# Patient Record
Sex: Female | Born: 1985 | Race: White | Hispanic: No | Marital: Married | Smoking: Never smoker
Health system: Southern US, Community
[De-identification: ages and names within clinical notes are randomized; demographics above are authoritative.]

## PROBLEM LIST (undated history)

## (undated) DIAGNOSIS — F32A Depression, unspecified: Secondary | ICD-10-CM

## (undated) DIAGNOSIS — N189 Chronic kidney disease, unspecified: Secondary | ICD-10-CM

## (undated) DIAGNOSIS — F329 Major depressive disorder, single episode, unspecified: Secondary | ICD-10-CM

## (undated) DIAGNOSIS — R55 Syncope and collapse: Secondary | ICD-10-CM

## (undated) DIAGNOSIS — Z87442 Personal history of urinary calculi: Secondary | ICD-10-CM

## (undated) DIAGNOSIS — F319 Bipolar disorder, unspecified: Secondary | ICD-10-CM

## (undated) DIAGNOSIS — F419 Anxiety disorder, unspecified: Secondary | ICD-10-CM

## (undated) HISTORY — PX: ADENOIDECTOMY: SUR15

## (undated) HISTORY — PX: ABDOMINAL HYSTERECTOMY: SHX81

## (undated) HISTORY — PX: TONSILLECTOMY: SUR1361

---
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
## 2007-08-11 ENCOUNTER — Ambulatory Visit: Payer: Self-pay | Admitting: Otolaryngology

## 2007-08-12 ENCOUNTER — Ambulatory Visit: Payer: Self-pay | Admitting: Otolaryngology

## 2008-09-04 ENCOUNTER — Emergency Department: Payer: Self-pay | Admitting: Emergency Medicine

## 2009-07-30 ENCOUNTER — Ambulatory Visit: Payer: Self-pay

## 2009-11-30 ENCOUNTER — Ambulatory Visit: Payer: Self-pay

## 2012-09-09 ENCOUNTER — Emergency Department: Payer: Self-pay | Admitting: Emergency Medicine

## 2013-11-06 LAB — OB RESULTS CONSOLE RUBELLA ANTIBODY, IGM: Rubella: IMMUNE

## 2013-11-06 LAB — OB RESULTS CONSOLE RPR: RPR: NONREACTIVE

## 2013-11-06 LAB — OB RESULTS CONSOLE GC/CHLAMYDIA
Chlamydia: NEGATIVE
GC PROBE AMP, GENITAL: NEGATIVE

## 2013-11-06 LAB — OB RESULTS CONSOLE ABO/RH: RH TYPE: POSITIVE

## 2013-11-06 LAB — OB RESULTS CONSOLE HEPATITIS B SURFACE ANTIGEN: Hepatitis B Surface Ag: NEGATIVE

## 2013-11-06 LAB — OB RESULTS CONSOLE GBS: GBS: NEGATIVE

## 2014-02-03 ENCOUNTER — Emergency Department: Payer: Self-pay | Admitting: Emergency Medicine

## 2014-02-03 LAB — URINALYSIS, COMPLETE
Glucose,UR: NEGATIVE mg/dL (ref 0–75)
KETONE: NEGATIVE
Leukocyte Esterase: NEGATIVE
Nitrite: NEGATIVE
Ph: 6 (ref 4.5–8.0)
Protein: 30
RBC,UR: 7078 /HPF (ref 0–5)
SPECIFIC GRAVITY: 1.02 (ref 1.003–1.030)
WBC UR: 35 /HPF (ref 0–5)

## 2014-03-08 ENCOUNTER — Inpatient Hospital Stay (HOSPITAL_COMMUNITY): Admission: AD | Admit: 2014-03-08 | Payer: Self-pay | Source: Ambulatory Visit | Admitting: Obstetrics & Gynecology

## 2014-06-05 ENCOUNTER — Encounter (HOSPITAL_COMMUNITY)
Admission: AD | Disposition: A | Discharge status: Home / Self Care | Payer: Self-pay | Source: Ambulatory Visit | Attending: Obstetrics and Gynecology

## 2014-06-05 ENCOUNTER — Encounter (HOSPITAL_COMMUNITY): Payer: Self-pay | Admitting: Medical

## 2014-06-05 ENCOUNTER — Inpatient Hospital Stay (HOSPITAL_COMMUNITY)
Admission: AD | Admit: 2014-06-05 | Discharge: 2014-06-07 | DRG: 765 | Disposition: A | Discharge status: Home / Self Care | Payer: 59 | Source: Ambulatory Visit | Attending: Obstetrics and Gynecology | Admitting: Obstetrics and Gynecology

## 2014-06-05 ENCOUNTER — Inpatient Hospital Stay (HOSPITAL_COMMUNITY): Payer: 59 | Admitting: Anesthesiology

## 2014-06-05 ENCOUNTER — Encounter (HOSPITAL_COMMUNITY): Payer: 59 | Admitting: Anesthesiology

## 2014-06-05 DIAGNOSIS — Z3403 Encounter for supervision of normal first pregnancy, third trimester: Secondary | ICD-10-CM | POA: Diagnosis present

## 2014-06-05 DIAGNOSIS — O26833 Pregnancy related renal disease, third trimester: Secondary | ICD-10-CM | POA: Diagnosis present

## 2014-06-05 DIAGNOSIS — N189 Chronic kidney disease, unspecified: Secondary | ICD-10-CM | POA: Diagnosis present

## 2014-06-05 DIAGNOSIS — Z3A36 36 weeks gestation of pregnancy: Secondary | ICD-10-CM | POA: Diagnosis present

## 2014-06-05 DIAGNOSIS — O42913 Preterm premature rupture of membranes, unspecified as to length of time between rupture and onset of labor, third trimester: Principal | ICD-10-CM | POA: Diagnosis present

## 2014-06-05 DIAGNOSIS — O4290 Premature rupture of membranes, unspecified as to length of time between rupture and onset of labor, unspecified weeks of gestation: Secondary | ICD-10-CM | POA: Diagnosis present

## 2014-06-05 HISTORY — DX: Chronic kidney disease, unspecified: N18.9

## 2014-06-05 LAB — CBC
HCT: 36.5 % (ref 36.0–46.0)
Hemoglobin: 12.1 g/dL (ref 12.0–15.0)
MCH: 30.3 pg (ref 26.0–34.0)
MCHC: 33.2 g/dL (ref 30.0–36.0)
MCV: 91.5 fL (ref 78.0–100.0)
Platelets: 142 10*3/uL — ABNORMAL LOW (ref 150–400)
RBC: 3.99 MIL/uL (ref 3.87–5.11)
RDW: 12.9 % (ref 11.5–15.5)
WBC: 10.1 10*3/uL (ref 4.0–10.5)

## 2014-06-05 LAB — TYPE AND SCREEN
ABO/RH(D): A POS
ANTIBODY SCREEN: NEGATIVE

## 2014-06-05 LAB — AMNISURE RUPTURE OF MEMBRANE (ROM) NOT AT ARMC: AMNISURE: POSITIVE

## 2014-06-05 LAB — RPR

## 2014-06-05 LAB — ABO/RH: ABO/RH(D): A POS

## 2014-06-05 LAB — HIV ANTIBODY (ROUTINE TESTING W REFLEX): HIV: NONREACTIVE

## 2014-06-05 SURGERY — Surgical Case
Anesthesia: Epidural | Site: Abdomen

## 2014-06-05 MED ORDER — SODIUM CHLORIDE 0.9 % IJ SOLN
INTRAMUSCULAR | Status: AC
  Filled 2014-06-05: qty 20

## 2014-06-05 MED ORDER — MORPHINE SULFATE (PF) 0.5 MG/ML IJ SOLN
INTRAMUSCULAR | Status: DC | PRN
  Administered 2014-06-05: 1 mg via INTRAVENOUS

## 2014-06-05 MED ORDER — SCOPOLAMINE 1 MG/3DAYS TD PT72
1.0000 | MEDICATED_PATCH | Freq: Once | TRANSDERMAL | Status: DC
  Administered 2014-06-05: 1.5 mg via TRANSDERMAL

## 2014-06-05 MED ORDER — EPHEDRINE 5 MG/ML INJ: 10.0000 mg | INTRAVENOUS | Status: DC | PRN

## 2014-06-05 MED ORDER — LACTATED RINGERS IV SOLN
INTRAVENOUS | Status: DC | PRN
  Administered 2014-06-05: 21:00:00 via INTRAVENOUS

## 2014-06-05 MED ORDER — KETOROLAC TROMETHAMINE 30 MG/ML IJ SOLN
INTRAMUSCULAR | Status: AC
  Filled 2014-06-05: qty 1

## 2014-06-05 MED ORDER — OXYTOCIN 10 UNIT/ML IJ SOLN
INTRAMUSCULAR | Status: AC
  Filled 2014-06-05: qty 4

## 2014-06-05 MED ORDER — OXYTOCIN 40 UNITS IN LACTATED RINGERS INFUSION - SIMPLE MED
1.0000 m[IU]/min | INTRAVENOUS | Status: DC
  Administered 2014-06-05: 2 m[IU]/min via INTRAVENOUS

## 2014-06-05 MED ORDER — OXYCODONE-ACETAMINOPHEN 5-325 MG PO TABS: 2.0000 | ORAL_TABLET | ORAL | Status: DC | PRN

## 2014-06-05 MED ORDER — PENICILLIN G POTASSIUM 5000000 UNITS IJ SOLR
2.5000 10*6.[IU] | INTRAVENOUS | Status: DC
  Administered 2014-06-05 (×3): 2.5 10*6.[IU] via INTRAVENOUS
  Filled 2014-06-05 (×5): qty 2.5

## 2014-06-05 MED ORDER — LIDOCAINE-EPINEPHRINE (PF) 2 %-1:200000 IJ SOLN
INTRAMUSCULAR | Status: AC
  Filled 2014-06-05: qty 20

## 2014-06-05 MED ORDER — KETOROLAC TROMETHAMINE 30 MG/ML IJ SOLN
30.0000 mg | Freq: Four times a day (QID) | INTRAMUSCULAR | Status: AC | PRN
Start: 2014-06-05 — End: 2014-06-06

## 2014-06-05 MED ORDER — PENICILLIN G POTASSIUM 5000000 UNITS IJ SOLR
5.0000 10*6.[IU] | Freq: Once | INTRAVENOUS | Status: AC
  Administered 2014-06-05: 5 10*6.[IU] via INTRAVENOUS
  Filled 2014-06-05: qty 5

## 2014-06-05 MED ORDER — SODIUM BICARBONATE 8.4 % IV SOLN
INTRAVENOUS | Status: DC | PRN
  Administered 2014-06-05: 5 mL via EPIDURAL
  Administered 2014-06-05: 3 mL via EPIDURAL
  Administered 2014-06-05: 5 mL via EPIDURAL

## 2014-06-05 MED ORDER — ONDANSETRON HCL 4 MG/2ML IJ SOLN
4.0000 mg | Freq: Four times a day (QID) | INTRAMUSCULAR | Status: DC | PRN
  Administered 2014-06-05: 4 mg via INTRAVENOUS
  Filled 2014-06-05: qty 2

## 2014-06-05 MED ORDER — PHENYLEPHRINE HCL 10 MG/ML IJ SOLN
INTRAMUSCULAR | Status: DC | PRN
  Administered 2014-06-05: 80 ug via INTRAVENOUS

## 2014-06-05 MED ORDER — LACTATED RINGERS IV SOLN
500.0000 mL | Freq: Once | INTRAVENOUS | Status: AC
  Administered 2014-06-05: 500 mL via INTRAVENOUS

## 2014-06-05 MED ORDER — BUTORPHANOL TARTRATE 1 MG/ML IJ SOLN
1.0000 mg | INTRAMUSCULAR | Status: DC | PRN
  Administered 2014-06-05: 1 mg via INTRAVENOUS
  Filled 2014-06-05: qty 1

## 2014-06-05 MED ORDER — FLEET ENEMA 7-19 GM/118ML RE ENEM: 1.0000 | ENEMA | RECTAL | Status: DC | PRN

## 2014-06-05 MED ORDER — CITRIC ACID-SODIUM CITRATE 334-500 MG/5ML PO SOLN
30.0000 mL | ORAL | Status: DC | PRN
  Administered 2014-06-05: 30 mL via ORAL
  Filled 2014-06-05: qty 15

## 2014-06-05 MED ORDER — CEFAZOLIN SODIUM-DEXTROSE 2-3 GM-% IV SOLR
INTRAVENOUS | Status: AC
  Filled 2014-06-05: qty 50

## 2014-06-05 MED ORDER — TERBUTALINE SULFATE 1 MG/ML IJ SOLN: 0.2500 mg | Freq: Once | INTRAMUSCULAR | Status: AC | PRN

## 2014-06-05 MED ORDER — OXYCODONE-ACETAMINOPHEN 5-325 MG PO TABS: 1.0000 | ORAL_TABLET | ORAL | Status: DC | PRN

## 2014-06-05 MED ORDER — FENTANYL 2.5 MCG/ML BUPIVACAINE 1/10 % EPIDURAL INFUSION (WH - ANES)
14.0000 mL/h | INTRAMUSCULAR | Status: DC | PRN
  Administered 2014-06-05: 14 mL/h via EPIDURAL

## 2014-06-05 MED ORDER — ONDANSETRON HCL 4 MG/2ML IJ SOLN
INTRAMUSCULAR | Status: AC
  Filled 2014-06-05: qty 2

## 2014-06-05 MED ORDER — KETOROLAC TROMETHAMINE 30 MG/ML IJ SOLN
30.0000 mg | Freq: Four times a day (QID) | INTRAMUSCULAR | Status: AC | PRN
  Administered 2014-06-05: 30 mg via INTRAVENOUS

## 2014-06-05 MED ORDER — LACTATED RINGERS IV SOLN
INTRAVENOUS | Status: DC
  Administered 2014-06-05 (×5): via INTRAVENOUS

## 2014-06-05 MED ORDER — LACTATED RINGERS IV SOLN
40.0000 [IU] | INTRAVENOUS | Status: DC | PRN
  Administered 2014-06-05: 40 [IU] via INTRAVENOUS

## 2014-06-05 MED ORDER — LACTATED RINGERS IV SOLN: 500.0000 mL | INTRAVENOUS | Status: DC | PRN

## 2014-06-05 MED ORDER — FENTANYL CITRATE 0.05 MG/ML IJ SOLN
INTRAMUSCULAR | Status: AC
  Filled 2014-06-05: qty 2

## 2014-06-05 MED ORDER — CEFAZOLIN SODIUM-DEXTROSE 2-3 GM-% IV SOLR
INTRAVENOUS | Status: DC | PRN
  Administered 2014-06-05: 2 g via INTRAVENOUS

## 2014-06-05 MED ORDER — OXYTOCIN BOLUS FROM INFUSION: 500.0000 mL | INTRAVENOUS | Status: DC

## 2014-06-05 MED ORDER — OXYTOCIN 40 UNITS IN LACTATED RINGERS INFUSION - SIMPLE MED
62.5000 mL/h | INTRAVENOUS | Status: DC
  Filled 2014-06-05: qty 1000

## 2014-06-05 MED ORDER — LIDOCAINE HCL (PF) 1 % IJ SOLN: 30.0000 mL | INTRAMUSCULAR | Status: DC | PRN

## 2014-06-05 MED ORDER — MORPHINE SULFATE (PF) 0.5 MG/ML IJ SOLN: INTRAMUSCULAR | Status: DC | PRN

## 2014-06-05 MED ORDER — PHENYLEPHRINE 40 MCG/ML (10ML) SYRINGE FOR IV PUSH (FOR BLOOD PRESSURE SUPPORT): 80.0000 ug | PREFILLED_SYRINGE | INTRAVENOUS | Status: DC | PRN

## 2014-06-05 MED ORDER — FENTANYL CITRATE 0.05 MG/ML IJ SOLN
25.0000 ug | INTRAMUSCULAR | Status: DC | PRN
  Administered 2014-06-05 (×2): 50 ug via INTRAVENOUS

## 2014-06-05 MED ORDER — DIPHENHYDRAMINE HCL 50 MG/ML IJ SOLN: 12.5000 mg | INTRAMUSCULAR | Status: DC | PRN

## 2014-06-05 MED ORDER — MORPHINE SULFATE 0.5 MG/ML IJ SOLN
INTRAMUSCULAR | Status: AC
  Filled 2014-06-05: qty 10

## 2014-06-05 MED ORDER — ONDANSETRON HCL 4 MG/2ML IJ SOLN
INTRAMUSCULAR | Status: DC | PRN
  Administered 2014-06-05: 4 mg via INTRAVENOUS

## 2014-06-05 MED ORDER — MEPERIDINE HCL 25 MG/ML IJ SOLN: 6.2500 mg | INTRAMUSCULAR | Status: DC | PRN

## 2014-06-05 MED ORDER — FENTANYL 2.5 MCG/ML BUPIVACAINE 1/10 % EPIDURAL INFUSION (WH - ANES)
INTRAMUSCULAR | Status: AC
  Administered 2014-06-05: 14 mL/h via EPIDURAL
  Filled 2014-06-05: qty 125

## 2014-06-05 MED ORDER — BUPIVACAINE LIPOSOME 1.3 % IJ SUSP
20.0000 mL | Freq: Once | INTRAMUSCULAR | Status: AC
Start: 2014-06-05 — End: 2014-06-05
  Administered 2014-06-05: 20 mL
  Filled 2014-06-05: qty 20

## 2014-06-05 MED ORDER — LIDOCAINE HCL (PF) 1 % IJ SOLN
INTRAMUSCULAR | Status: DC | PRN
  Administered 2014-06-05 (×2): 5 mL

## 2014-06-05 MED ORDER — MORPHINE SULFATE (PF) 0.5 MG/ML IJ SOLN
INTRAMUSCULAR | Status: DC | PRN
  Administered 2014-06-05: 4 mg via EPIDURAL

## 2014-06-05 MED ORDER — PHENYLEPHRINE 40 MCG/ML (10ML) SYRINGE FOR IV PUSH (FOR BLOOD PRESSURE SUPPORT)
PREFILLED_SYRINGE | INTRAVENOUS | Status: AC
  Filled 2014-06-05: qty 10

## 2014-06-05 MED ORDER — SODIUM CHLORIDE 0.9 % IJ SOLN
INTRAMUSCULAR | Status: DC | PRN
  Administered 2014-06-05: 20 mL via INTRAVENOUS

## 2014-06-05 MED ORDER — SCOPOLAMINE 1 MG/3DAYS TD PT72
MEDICATED_PATCH | TRANSDERMAL | Status: AC
  Filled 2014-06-05: qty 1

## 2014-06-05 MED ORDER — PHENYLEPHRINE 40 MCG/ML (10ML) SYRINGE FOR IV PUSH (FOR BLOOD PRESSURE SUPPORT)
PREFILLED_SYRINGE | INTRAVENOUS | Status: AC
  Filled 2014-06-05: qty 5

## 2014-06-05 MED ORDER — ACETAMINOPHEN 325 MG PO TABS: 650.0000 mg | ORAL_TABLET | ORAL | Status: DC | PRN

## 2014-06-05 SURGICAL SUPPLY — 37 items
BENZOIN TINCTURE PRP APPL 2/3 (GAUZE/BANDAGES/DRESSINGS) ×3 IMPLANT
CLAMP CORD UMBIL (MISCELLANEOUS) IMPLANT
CLOSURE WOUND 1/2 X4 (GAUZE/BANDAGES/DRESSINGS) ×1
CLOTH BEACON ORANGE TIMEOUT ST (SAFETY) ×3 IMPLANT
CONTAINER PREFILL 10% NBF 15ML (MISCELLANEOUS) IMPLANT
COVER LIGHT HANDLE  1/PK (MISCELLANEOUS) ×4
COVER LIGHT HANDLE 1/PK (MISCELLANEOUS) ×2 IMPLANT
DERMABOND ADVANCED (GAUZE/BANDAGES/DRESSINGS)
DERMABOND ADVANCED .7 DNX12 (GAUZE/BANDAGES/DRESSINGS) IMPLANT
DRAPE SHEET LG 3/4 BI-LAMINATE (DRAPES) IMPLANT
DRSG OPSITE POSTOP 4X10 (GAUZE/BANDAGES/DRESSINGS) ×3 IMPLANT
DURAPREP 26ML APPLICATOR (WOUND CARE) ×3 IMPLANT
ELECT REM PT RETURN 9FT ADLT (ELECTROSURGICAL) ×3
ELECTRODE REM PT RTRN 9FT ADLT (ELECTROSURGICAL) ×1 IMPLANT
EXTRACTOR VACUUM M CUP 4 TUBE (SUCTIONS) ×2 IMPLANT
EXTRACTOR VACUUM M CUP 4' TUBE (SUCTIONS) ×1
GLOVE BIO SURGEON STRL SZ7.5 (GLOVE) ×3 IMPLANT
GOWN STRL REUS W/TWL LRG LVL3 (GOWN DISPOSABLE) ×6 IMPLANT
KIT ABG SYR 3ML LUER SLIP (SYRINGE) ×3 IMPLANT
NEEDLE HYPO 21X1.5 SAFETY (NEEDLE) ×3 IMPLANT
NEEDLE HYPO 25X5/8 SAFETYGLIDE (NEEDLE) ×3 IMPLANT
NS IRRIG 1000ML POUR BTL (IV SOLUTION) ×3 IMPLANT
PACK C SECTION WH (CUSTOM PROCEDURE TRAY) ×3 IMPLANT
PAD OB MATERNITY 4.3X12.25 (PERSONAL CARE ITEMS) ×3 IMPLANT
STAPLER VISISTAT 35W (STAPLE) IMPLANT
STRIP CLOSURE SKIN 1/2X4 (GAUZE/BANDAGES/DRESSINGS) ×2 IMPLANT
SUT MNCRL 0 VIOLET CTX 36 (SUTURE) ×4 IMPLANT
SUT MONOCRYL 0 CTX 36 (SUTURE) ×8
SUT PDS AB 0 CTX 60 (SUTURE) ×3 IMPLANT
SUT PLAIN 0 NONE (SUTURE) IMPLANT
SUT PLAIN 2 0 (SUTURE) ×2
SUT PLAIN ABS 2-0 CT1 27XMFL (SUTURE) ×1 IMPLANT
SUT VIC AB 4-0 KS 27 (SUTURE) ×3 IMPLANT
SYR 20CC LL (SYRINGE) ×3 IMPLANT
TOWEL OR 17X24 6PK STRL BLUE (TOWEL DISPOSABLE) ×3 IMPLANT
TRAY FOLEY CATH 14FR (SET/KITS/TRAYS/PACK) ×3 IMPLANT
WATER STERILE IRR 1000ML POUR (IV SOLUTION) ×3 IMPLANT

## 2014-06-05 NOTE — MAU Note (Signed)
Amnisure to be performed and if positive, the patient may be admitted, if negative, the patient will go home as per Dr. Renaldo FiddlerAdkins.

## 2014-06-05 NOTE — Brief Op Note (Signed)
06/05/2014  9:35 PM  PATIENT:  Gina Gilbert  28 y.o. female  PRE-OPERATIVE DIAGNOSIS:  failure to progress  POST-OPERATIVE DIAGNOSIS:  failure to progress  PROCEDURE:  Procedure(s): CESAREAN SECTION (N/A)  SURGEON:  Surgeon(s) and Role:    * Leslie AndreaJames E Leyli Kevorkian II, MD - Primary  PHYSICIAN ASSISTANT:   ASSISTANTS: none   ANESTHESIA:   epidural  EBL:  Total I/O In: 1500 [I.V.:1500] Out: 800 [Blood:800]  BLOOD ADMINISTERED:none  DRAINS: Urinary Catheter (Foley)   LOCAL MEDICATIONS USED:  OTHER 20 ml Exparel in 20 ml saline  SPECIMEN:  Source of Specimen:  placenta  DISPOSITION OF SPECIMEN:  PATHOLOGY  COUNTS:  YES  TOURNIQUET:  * No tourniquets in log *  DICTATION: .Other Dictation: Dictation Number [id number]  PLAN OF CARE: Admit to inpatient   PATIENT DISPOSITION:  PACU - hemodynamically stable.   Delay start of Pharmacological VTE agent (>24hrs) due to surgical blood loss or risk of bleeding: not applicable

## 2014-06-05 NOTE — H&P (Signed)
Gina Gilbert is a [name] y.o. female presenting for SROM about 1am, clear. Few contractions. No HA, no epigastric pain, no vision change, no fever. Maternal Medical History:  Reason for admission: Rupture of membranes.   Fetal activity: Perceived fetal activity is normal.      OB History   Grav Para Term Preterm Abortions TAB SAB Ect Mult Living   1              Past Medical History  Diagnosis Date  . Chronic kidney disease    Past Surgical History  Procedure Laterality Date  . Tonsillectomy    . Adenoidectomy    . Wisdom tooth extraction  2002   Family History: family history is not on file. Social History:  reports that she has never smoked. She has never used smokeless tobacco. She reports that she drinks about .6 ounces of alcohol per week. She reports that she does not use illicit drugs.   Prenatal Transfer Tool  Maternal Diabetes: No Genetic Screening: Normal Maternal Ultrasounds/Referrals: Normal Fetal Ultrasounds or other Referrals:  None Maternal Substance Abuse:  No Significant Maternal Medications:  None Significant Maternal Lab Results:  None Other Comments:  None  Review of Systems  Eyes: Negative for blurred vision.  Gastrointestinal: Negative for abdominal pain.  Neurological: Negative for headaches.    Dilation: 2 Effacement (%): 60 Station: -3;Ballotable Exam by:: Dr. Henderson Cloudomblin Blood pressure 113/70, pulse 108, temperature 98 F (36.7 C), temperature source Oral, resp. rate 18, height 5\' 6"  (1.676 m), weight 87.091 kg (192 lb). Maternal Exam:  Uterine Assessment: Contraction strength is mild.  Contraction frequency is irregular.   Abdomen: Fetal presentation: vertex     Fetal Exam Fetal State Assessment: Category I - tracings are normal.     Physical Exam  Cardiovascular: Normal rate and regular rhythm.   Respiratory: Effort normal and breath sounds normal.  GI: Soft. There is no tenderness.  Neurological: She has normal reflexes.    Prenatal labs: ABO, Rh:   Antibody:   Rubella:   RPR:    HBsAg:    HIV:    GBS:     Assessment/Plan: PROM at 36 2/7 weeks GBBS unknown atb per GBBS protocol D/W patient and husband above, will begin pitocin, reviewed risks All questions answered   Gina Gilbert,Gina Gilbert 06/05/2014, 8:04 AM

## 2014-06-05 NOTE — Transfer of Care (Signed)
Immediate Anesthesia Transfer of Care Note  Patient: Gina Gilbert  Procedure(s) Performed: Procedure(s): CESAREAN SECTION (N/A)  Patient Location: PACU  Anesthesia Type:Regional  Level of Consciousness: awake, alert , oriented and patient cooperative  Airway & Oxygen Therapy: Patient Spontanous Breathing  Post-op Assessment: Report given to PACU RN and Post -op Vital signs reviewed and stable  Post vital signs: Reviewed and stable  Complications: No apparent anesthesia complications

## 2014-06-05 NOTE — Anesthesia Postprocedure Evaluation (Signed)
  Anesthesia Post-op Note  Patient: Gina Gilbert  Procedure(s) Performed: Procedure(s): CESAREAN SECTION (N/A)  Patient is awake, responsive, moving her legs, and has signs of resolution of her numbness. Pain and nausea are reasonably well controlled. Vital signs are stable and clinically acceptable. Oxygen saturation is clinically acceptable. There are no apparent anesthetic complications at this time. Patient is ready for discharge.

## 2014-06-05 NOTE — Progress Notes (Signed)
Cx no change UCs q2-3 min FHT reactive  A/P: arrest of descent         Rec: C/S-D/W patient and husband and risks including infection, organ damage, bleeding/transfusion-HIV/Hep, DVT/PE, pneumonia        All questions answered

## 2014-06-05 NOTE — Progress Notes (Signed)
Cx 2-3/90/-3 UCs q2-3 min IUPC placed FHT reactive, decel to 80s while sitting after IUPC placement-promptly resolved with position change to LLD  D/W patient above

## 2014-06-05 NOTE — MAU Provider Note (Signed)
Ms. Gina Gilbert is a 28 y.o. G1P0 at 5122w2d  who presents to MAU today complaining LOF. The patient states occasional contractions as well. She denies vaginal bleeding or complications with her pregnancy.   BP 127/81  Pulse 115  Temp(Src) 98.1 F (36.7 C) (Oral)  Resp 18  Ht 5\' 6"  (1.676 m)  Wt 192 lb 6.4 oz (87.272 kg)  BMI 31.07 kg/m2 GENERAL: Well-developed, well-nourished female in no acute distress.  HEENT: Normocephalic, atraumatic.  LUNGS: Normal effort HEART: Regular rate  ABDOMEN: Soft, nontender, nondistended.  PELVIC: Normal external female genitalia. Vagina is pink and rugated.  Normal discharge.  negative pooling. Gravid uterus.   EXTREMITIES: No cyanosis, clubbing, or edema  Fern - negative  Fetal Monitoring:  Baseline: 125 bpm, moderate variability, + accelerations, no decelerations Contractions: q 1-5 minutes  A: Membranes intact  P: Report given to RN to contact MD on call for further instructions  Marny LowensteinJulie N Jhana Giarratano, PA-C 06/05/2014 4:50 AM

## 2014-06-05 NOTE — Anesthesia Preprocedure Evaluation (Signed)

## 2014-06-05 NOTE — Anesthesia Procedure Notes (Signed)
Epidural Patient location during procedure: OB Start time: 06/05/2014 3:48 PM  Staffing Anesthesiologist: Brayton CavesJACKSON, Jazara Swiney Performed by: anesthesiologist   Preanesthetic Checklist Completed: patient identified, site marked, surgical consent, pre-op evaluation, timeout performed, IV checked, risks and benefits discussed and monitors and equipment checked  Epidural Patient position: sitting Prep: site prepped and draped and DuraPrep Patient monitoring: continuous pulse ox and blood pressure Approach: midline Location: L3-L4 Injection technique: LOR air  Needle:  Needle type: Tuohy  Needle gauge: 17 G Needle length: 9 cm and 9 Needle insertion depth: 5 cm cm Catheter type: closed end flexible Catheter size: 19 Gauge Catheter at skin depth: 10 cm Test dose: negative  Assessment Events: blood not aspirated, injection not painful, no injection resistance, negative IV test and no paresthesia  Additional Notes Patient identified.  Risk benefits discussed including failed block, incomplete pain control, headache, nerve damage, paralysis, blood pressure changes, nausea, vomiting, reactions to medication both toxic or allergic, and postpartum back pain.  Patient expressed understanding and wished to proceed.  All questions were answered.  Sterile technique used throughout procedure and epidural site dressed with sterile barrier dressing. No paresthesia or other complications noted.The patient did not experience any signs of intravascular injection such as tinnitus or metallic taste in mouth nor signs of intrathecal spread such as rapid motor block. Please see nursing notes for vital signs.

## 2014-06-05 NOTE — MAU Note (Signed)
Leaking clear fluid since 0052 this morning. Denies vaginal bleeding. Positive fetal movement.

## 2014-06-06 LAB — CBC
HCT: 31.6 % — ABNORMAL LOW (ref 36.0–46.0)
HEMOGLOBIN: 10.2 g/dL — AB (ref 12.0–15.0)
MCH: 29.7 pg (ref 26.0–34.0)
MCHC: 32.3 g/dL (ref 30.0–36.0)
MCV: 91.9 fL (ref 78.0–100.0)
Platelets: 121 10*3/uL — ABNORMAL LOW (ref 150–400)
RBC: 3.44 MIL/uL — ABNORMAL LOW (ref 3.87–5.11)
RDW: 13.1 % (ref 11.5–15.5)
WBC: 10.6 10*3/uL — ABNORMAL HIGH (ref 4.0–10.5)

## 2014-06-06 MED ORDER — LANOLIN HYDROUS EX OINT: 1.0000 "application " | TOPICAL_OINTMENT | CUTANEOUS | Status: DC | PRN

## 2014-06-06 MED ORDER — SIMETHICONE 80 MG PO CHEW
80.0000 mg | CHEWABLE_TABLET | ORAL | Status: DC
  Administered 2014-06-06: 80 mg via ORAL
  Filled 2014-06-06: qty 1

## 2014-06-06 MED ORDER — DIPHENHYDRAMINE HCL 25 MG PO CAPS
25.0000 mg | ORAL_CAPSULE | ORAL | Status: DC | PRN
  Administered 2014-06-06 (×3): 25 mg via ORAL
  Filled 2014-06-06 (×3): qty 1

## 2014-06-06 MED ORDER — DEXTROSE 5 % IV SOLN
1.0000 ug/kg/h | INTRAVENOUS | Status: DC | PRN
  Filled 2014-06-06: qty 2

## 2014-06-06 MED ORDER — ZOLPIDEM TARTRATE 5 MG PO TABS
5.0000 mg | ORAL_TABLET | Freq: Every evening | ORAL | Status: DC | PRN
Start: 2014-06-06 — End: 2014-06-07

## 2014-06-06 MED ORDER — OXYTOCIN 40 UNITS IN LACTATED RINGERS INFUSION - SIMPLE MED: 62.5000 mL/h | INTRAVENOUS | Status: AC

## 2014-06-06 MED ORDER — DIBUCAINE 1 % RE OINT: 1.0000 "application " | TOPICAL_OINTMENT | RECTAL | Status: DC | PRN

## 2014-06-06 MED ORDER — SODIUM CHLORIDE 0.9 % IJ SOLN: 3.0000 mL | INTRAMUSCULAR | Status: DC | PRN

## 2014-06-06 MED ORDER — OXYCODONE-ACETAMINOPHEN 5-325 MG PO TABS: 2.0000 | ORAL_TABLET | ORAL | Status: DC | PRN

## 2014-06-06 MED ORDER — NALBUPHINE HCL 10 MG/ML IJ SOLN: 5.0000 mg | Freq: Once | INTRAMUSCULAR | Status: AC | PRN

## 2014-06-06 MED ORDER — DIPHENHYDRAMINE HCL 25 MG PO CAPS: 25.0000 mg | ORAL_CAPSULE | Freq: Four times a day (QID) | ORAL | Status: DC | PRN

## 2014-06-06 MED ORDER — NALOXONE HCL 0.4 MG/ML IJ SOLN: 0.4000 mg | INTRAMUSCULAR | Status: DC | PRN

## 2014-06-06 MED ORDER — MENTHOL 3 MG MT LOZG: 1.0000 | LOZENGE | OROMUCOSAL | Status: DC | PRN

## 2014-06-06 MED ORDER — ONDANSETRON HCL 4 MG/2ML IJ SOLN: 4.0000 mg | Freq: Three times a day (TID) | INTRAMUSCULAR | Status: DC | PRN

## 2014-06-06 MED ORDER — DIPHENHYDRAMINE HCL 50 MG/ML IJ SOLN
12.5000 mg | INTRAMUSCULAR | Status: DC | PRN
  Administered 2014-06-06: 12.5 mg via INTRAVENOUS
  Filled 2014-06-06: qty 1

## 2014-06-06 MED ORDER — ONDANSETRON HCL 4 MG PO TABS
4.0000 mg | ORAL_TABLET | ORAL | Status: DC | PRN
Start: 2014-06-06 — End: 2014-06-07

## 2014-06-06 MED ORDER — SIMETHICONE 80 MG PO CHEW: 80.0000 mg | CHEWABLE_TABLET | ORAL | Status: DC | PRN

## 2014-06-06 MED ORDER — NALBUPHINE HCL 10 MG/ML IJ SOLN
5.0000 mg | INTRAMUSCULAR | Status: DC | PRN
  Administered 2014-06-06 (×2): 5 mg via INTRAVENOUS
  Filled 2014-06-06 (×2): qty 1

## 2014-06-06 MED ORDER — PRENATAL MULTIVITAMIN CH
1.0000 | ORAL_TABLET | Freq: Every day | ORAL | Status: DC
  Administered 2014-06-06 – 2014-06-07 (×2): 1 via ORAL
  Filled 2014-06-06 (×2): qty 1

## 2014-06-06 MED ORDER — WITCH HAZEL-GLYCERIN EX PADS: 1.0000 "application " | MEDICATED_PAD | CUTANEOUS | Status: DC | PRN

## 2014-06-06 MED ORDER — CEFAZOLIN SODIUM-DEXTROSE 2-3 GM-% IV SOLR: 2.0000 g | INTRAVENOUS | Status: DC

## 2014-06-06 MED ORDER — LACTATED RINGERS IV SOLN
INTRAVENOUS | Status: DC
Start: 2014-06-06 — End: 2014-06-07

## 2014-06-06 MED ORDER — ONDANSETRON HCL 4 MG/2ML IJ SOLN: 4.0000 mg | INTRAMUSCULAR | Status: DC | PRN

## 2014-06-06 MED ORDER — TETANUS-DIPHTH-ACELL PERTUSSIS 5-2.5-18.5 LF-MCG/0.5 IM SUSP: 0.5000 mL | Freq: Once | INTRAMUSCULAR | Status: DC

## 2014-06-06 MED ORDER — OXYCODONE-ACETAMINOPHEN 5-325 MG PO TABS
1.0000 | ORAL_TABLET | ORAL | Status: DC | PRN
  Administered 2014-06-06 – 2014-06-07 (×3): 1 via ORAL
  Filled 2014-06-06 (×4): qty 1

## 2014-06-06 MED ORDER — NALBUPHINE HCL 10 MG/ML IJ SOLN
5.0000 mg | INTRAMUSCULAR | Status: DC | PRN
  Administered 2014-06-06: 5 mg via SUBCUTANEOUS
  Filled 2014-06-06: qty 1

## 2014-06-06 MED ORDER — SIMETHICONE 80 MG PO CHEW
80.0000 mg | CHEWABLE_TABLET | Freq: Three times a day (TID) | ORAL | Status: DC
  Administered 2014-06-06 – 2014-06-07 (×4): 80 mg via ORAL
  Filled 2014-06-06 (×4): qty 1

## 2014-06-06 MED ORDER — IBUPROFEN 600 MG PO TABS
600.0000 mg | ORAL_TABLET | Freq: Four times a day (QID) | ORAL | Status: DC
  Administered 2014-06-06 – 2014-06-07 (×6): 600 mg via ORAL
  Filled 2014-06-06 (×6): qty 1

## 2014-06-06 MED ORDER — SENNOSIDES-DOCUSATE SODIUM 8.6-50 MG PO TABS
2.0000 | ORAL_TABLET | ORAL | Status: DC
  Filled 2014-06-06: qty 2

## 2014-06-06 NOTE — Anesthesia Postprocedure Evaluation (Signed)
  Anesthesia Post-op Note  Patient: Gina Gilbert  Procedure(s) Performed: Procedure(s): CESAREAN SECTION (N/A)  Patient Location: Mother/Baby  Anesthesia Type:Epidural  Level of Consciousness: awake, alert , oriented and patient cooperative  Airway and Oxygen Therapy: Patient Spontanous Breathing  Post-op Pain: mild  Post-op Assessment: Post-op Vital signs reviewed, Patient's Cardiovascular Status Stable, Respiratory Function Stable, Patent Airway, No headache, No backache, No residual numbness and No residual motor weakness  Post-op Vital Signs: Reviewed and stable  Last Vitals:  Filed Vitals:   06/06/14 0752  BP: 106/65  Pulse: 102  Temp: 37.2 C  Resp: 18    Complications: No apparent anesthesia complications

## 2014-06-06 NOTE — Addendum Note (Signed)
Addendum created 06/06/14 0949 by Yolonda KidaAlison L Trenee Igoe, CRNA   Modules edited: Notes Section   Notes Section:  File: [id number]

## 2014-06-06 NOTE — Progress Notes (Signed)
MOB was referred for history of depression/anxiety.  Referral screened out by Clinical Social Worker because none of the following criteria appear to apply: -History of anxiety/depression during this pregnancy, or of post-partum depression. -Diagnosis of anxiety and/or depression within last 3 years - History of depression due to pregnancy loss/loss of child OR  -MOB's symptoms currently being treated with medication and/or therapy.  Please contact the Clinical Social Worker if needs arise or upon MOB's request.   CSW completed chart review and OB prenatal records document that MOB was previously prescribed Zoloft and has been off for years, and stated that she takes Xanax PRN.  

## 2014-06-06 NOTE — Op Note (Signed)
NAME:  Gina Gilbert, Gina Gilbert            ACCOUNT NO.:  [id number]  MEDICAL RECORD NO.:  [id number]  LOCATION:  9122                          FACILITY:  [facility]  PHYSICIAN:  Guy SandiferJames E. Henderson Cloudomblin, M.D. DATE OF BIRTH:  02/12/1986  DATE OF PROCEDURE:  06/05/2014 DATE OF DISCHARGE:                              OPERATIVE REPORT   PREOPERATIVE DIAGNOSIS:  Arrest of descent.  POSTOPERATIVE DIAGNOSIS:  Arrest of descent.  PROCEDURE:  Low transverse cesarean section.  SURGEON:  Guy SandiferJames E. Henderson Cloudomblin, M.D.  ANESTHESIA:  Epidural; Germaine PomfretE. Carswell Jackson, M.D.  ESTIMATED BLOOD LOSS:  400 mL.  FINDINGS:  Viable female infant.  Apgars, arterial cord pH, and weight pending.  SPECIMENS:  Placenta to Pathology.  INDICATIONS AND CONSENT:  This patient is a 28 year old, G1, P0, at 4836- 2/7th weeks who presents in labor.  The patient progresses to 2 cm to 3 cm, 90% effaced, -3 station.  With 2-1/2 to 3 hours of good labor, she fails to make any descent or change at all.  Cesarean section is discussed.  Potential risks and complications are reviewed preoperatively including, but not limited to, infection, organ damage, bleeding requiring transfusion of blood products with HIV and hepatitis acquisition, DVT, PE, pneumonia.  All questions were answered and consent is signed on the chart.  PROCEDURE IN DETAIL:  The patient was taken to the operating room, where she was identified and her epidural was augmented to a surgical level by Dr. Jean RosenthalJackson.  She was placed in dorsal supine position with 15-degree left lateral wedge.  Foley catheter was already in place.  She is prepped and draped in a sterile fashion per [facility] protocol. Time-out undertaken.  After testing for adequate epidural anesthesia, skin was entered through a Pfannenstiel incision and dissection was carried out in layers of the peritoneum.  Peritoneum was incised and extended superiorly and inferiorly.  Vesicouterine peritoneum was taken down  cephalad laterally.  Bladder flap was developed.  The bladder blade was placed.  Uterus was incised in a low transverse manner, and the uterine cavity was entered bluntly with a hemostat.  Uterine incision was extended cephalad laterally with fingers.  Vertex was elevated into the incision.  The Mushroom vacuum extractor was used to elevate the vertex through the incision.  Remainder of the baby delivered without difficulty.  Good cry and tone was noted.  Cord was clamped and cut, and the baby was handed to awaiting pediatrics team.  Placenta was manually delivered and sent to Pathology.  Uterine cavity was cleaned.  Uterus was closed in 2 running locking imbricating layers of 0 Monocryl suture which achieved good hemostasis.  Anterior peritoneum was closed in a running fashion with 0 Monocryl suture which was also used to reapproximate the pyramidalis muscle in the midline.  Anterior rectus fascia was closed in a running fashion with a 0 looped PDS suture.  20 mL of Exparel diluted in 20 mL of saline, it was then injected both subfascially and subcutaneously.  Subcutaneous layer was reapproximated with interrupted plain suture, and the skin was closed with Vicryl on a Mellody DanceKeith.  Steri- Strips were applied.  All counts were correct.  The patient was taken to recovery room in  stable condition.     Guy SandiferJames E. Henderson Cloudomblin, M.D.     JET/MEDQ  D:  06/05/2014  T:  06/06/2014  Job:  [id number]

## 2014-06-06 NOTE — Lactation Note (Signed)
This note was copied from the chart of Gina Gilbert. Lactation Consultation Note New mom LPI 36 wks. Mom feels baby is older. Bruised face, rooting. Mom has everted nipples w/short shaft, unable to obtain deep latch d/t edema to bouncy areolas. Reverse pressure taught. Hand expression taught. In football position STS ontained deep latch #16 NS to Rt. Breast. Heard autible swallows.  Patient Name: Gina Glory RosebushKenzie Mcbrien Mom encouraged to feed baby 8-12 times/24 hours and with feeding cues. Mom encouraged to waken baby for feeds. [facility] brochure given w/resources, support groups and LC services. Educated about newborn behavior of LPI. Information sheet given. Hand pump given d/t no DEBP. Mom states she has one and will get family to bring it. Mom knows to pump q3h for 15-20 min.[facility] brochure given w/resources, support groups and LC services.[facility] brochure given w/resources, support groups and LC services. Today's Date: 06/06/2014 Reason for consult: Initial assessment   Maternal Data Has patient been taught Hand Expression?: Yes Does the patient have breastfeeding experience prior to this delivery?: No  Feeding Feeding Type: Breast Fed Length of feed: 20 min  LATCH Score/Interventions Latch: Grasps breast easily, tongue down, lips flanged, rhythmical sucking. Intervention(s): Adjust position;Assist with latch;Breast massage;Breast compression  Audible Swallowing: A few with stimulation Intervention(s): Hand expression;Skin to skin Intervention(s): Skin to skin;Alternate breast massage;Hand expression  Type of Nipple: Everted at rest and after stimulation Intervention(s): Shells;Reverse pressure;Hand pump  Comfort (Breast/Nipple): Soft / non-tender     Hold (Positioning): Assistance needed to correctly position infant at breast and maintain latch. Intervention(s): Breastfeeding basics reviewed;Support Pillows;Position options;Skin to skin  LATCH Score: 8  Lactation Tools  Discussed/Used Tools: Shells;Nipple Dorris CarnesShields;Pump Nipple shield size: 16;20 Shell Type: Inverted Breast pump type: Manual Pump Review: Setup, frequency, and cleaning;Milk Storage Initiated by:: Peri JeffersonL. Arrin Pintor RN Date initiated:: 06/06/14   Consult Status Consult Status: Follow-up Date: 06/06/14 Follow-up type: In-patient    Amogh Komatsu, Diamond NickelLAURA G 06/06/2014, 3:19 AM

## 2014-06-06 NOTE — Progress Notes (Signed)
Subjective: Postpartum Day 1: Cesarean Delivery Patient reports tolerating PO.  Complains of itching  Objective: Vital signs in last 24 hours: Temp:  [97.7 F (36.5 C)-99.7 F (37.6 C)] 98.9 F (37.2 C) (10/13 0752) Pulse Rate:  [94-127] 102 (10/13 0752) Resp:  [14-23] 18 (10/13 0752) BP: (97-134)/(47-75) 106/65 mmHg (10/13 0752) SpO2:  [87 %-100 %] 97 % (10/13 0554)  Physical Exam:  General: alert and cooperative Lochia: appropriate Uterine Fundus: firm Incision: honeycomb dressing noted with old drainage noted on bandage DVT Evaluation: No evidence of DVT seen on physical exam. Negative Homan's sign. No cords or calf tenderness. Calf/Ankle edema is present.   Recent Labs  06/05/14 0550 06/06/14 0625  HGB 12.1 10.2*  HCT 36.5 31.6*    Assessment/Plan: Status post Cesarean section. Doing well postoperatively.  Continue current care.  CURTIS,CAROL G 06/06/2014, 8:10 AM

## 2014-06-07 ENCOUNTER — Encounter (HOSPITAL_COMMUNITY): Payer: Self-pay | Admitting: Obstetrics and Gynecology

## 2014-06-07 MED ORDER — IBUPROFEN 600 MG PO TABS: 600.0000 mg | ORAL_TABLET | Freq: Four times a day (QID) | ORAL | Status: DC

## 2014-06-07 MED ORDER — OXYCODONE-ACETAMINOPHEN 5-325 MG PO TABS: 1.0000 | ORAL_TABLET | ORAL | Status: DC | PRN

## 2014-06-07 NOTE — Lactation Note (Signed)
This note was copied from the chart of Gina Gilbert. Lactation Consultation Note     Follow up consult with this mom and baby, now 9339 hours old. The baby is [redacted] weeks gestation, but weighs 7 1/2 pounds, ad acts more like a term baby. Mom thinks her dates may have been offl. She was having trouble latching the baby deeply, and has a scab on her right nipple. i assisted mom with football hold to the right breat, and showed her how to do an asymmetrical latch, keeping the bottom lip flanged, and obtaining a deeper latch. Mom was surprised that the latch was comfortable. Mom has been using her personal DEP to supplement  the baby with up to 8 mls of EBM. Her milk is transitioning in. I reviewed breast care with mom, and reminded her that op/p lactation was available as needed.  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 Her tongue frenulum is posterior, very obvious, causing a slight cleft in the tip of her tongue with extension. The baby is able to pull my finger in well with sucking, and can place ehr tongue well under my finger. I made mom aware of my findings, and suggested she mention these to her pediatrician. I think mom and baby would do well is discharged home today. Mom has a  Ped. appt set up for 11 am tomorrow.   Patient Name: Gina Gilbert GNFAO'ZToday's Date: 06/07/2014 Reason for consult: Follow-up assessment   Maternal Data    Feeding Feeding Type: Breast Fed Length of feed: 10 min  LATCH Score/Interventions Latch: Repeated attempts needed to sustain latch, nipple held in mouth throughout feeding, stimulation needed to elicit sucking reflex. Intervention(s): Skin to skin;Teach feeding cues;Waking techniques Intervention(s): Adjust position;Assist with latch;Breast compression  Audible Swallowing: A few with stimulation Intervention(s): Skin to skin;Hand  expression Intervention(s): Hand expression  Type of Nipple: Everted at rest and after stimulation  Comfort (Breast/Nipple): Filling, red/small blisters or bruises, mild/mod discomfort  Problem noted: Mild/Moderate discomfort Interventions  (Cracked/bleeding/bruising/blister): Expressed breast milk to nipple;Double electric pump  Hold (Positioning): Assistance needed to correctly position infant at breast and maintain latch. Intervention(s): Breastfeeding basics reviewed;Support Pillows;Position options;Skin to skin  LATCH Score: 6  Lactation Tools Discussed/Used     Consult Status Consult Status: Complete Follow-up type: Call as needed    Alfred LevinsLee, Mattalyn Anderegg Anne 06/07/2014, 12:28 PM

## 2014-06-07 NOTE — Discharge Instructions (Signed)
Call MD for T>100.4, heavy vaginal bleeding, severe abdominal pain, intractable nausea and/or vomiting, or respiratory distress.  Call office to schedule 1 week incision check.  No driving while taking narcotics.  Pelvic rest x 6 weeks.

## 2014-06-07 NOTE — Discharge Summary (Signed)
Obstetric Discharge Summary Reason for Admission: rupture of membranes Prenatal Procedures: none Intrapartum Procedures: cesarean: low cervical, transverse Postpartum Procedures: none Complications-Operative and Postpartum: none Hemoglobin  Date Value Ref Range Status  06/06/2014 10.2* 12.0 - 15.0 g/dL Final     HCT  Date Value Ref Range Status  06/06/2014 31.6* 36.0 - 46.0 % Final    Physical Exam:  General: alert, cooperative and appears stated age Lochia: appropriate Uterine Fundus: firm Incision: healing well, no significant drainage, no dehiscence DVT Evaluation: No evidence of DVT seen on physical exam. Negative Homan's sign. No cords or calf tenderness.  Discharge Diagnoses: PPROM; delivered  Discharge Information: Date: 06/07/2014 Activity: pelvic rest Diet: routine Medications: PNV, Ibuprofen and Percocet Condition: stable Instructions: refer to practice specific booklet Discharge to: home   Newborn Data: Live born female  Birth Weight: 7 lb 14.5 oz (3585 g) APGAR: 9, 9  Home with mother.  Gina Gilbert 06/07/2014, 1:18 PM

## 2014-06-07 NOTE — Progress Notes (Signed)
Subjective: Postpartum Day 2: Cesarean Delivery Patient reports incisional pain, tolerating PO and no problems voiding.    Objective: Vital signs in last 24 hours: Temp:  [97.5 F (36.4 C)-98.8 F (37.1 C)] 97.5 F (36.4 C) (10/14 0558) Pulse Rate:  [96-104] 98 (10/14 0558) Resp:  [18] 18 (10/14 0558) BP: (98-121)/(53-69) 104/69 mmHg (10/14 0558) SpO2:  [94 %-97 %] 97 % (10/14 0558)  Physical Exam:  General: alert, cooperative and appears stated age Lochia: appropriate Uterine Fundus: firm Incision: healing well, no significant drainage, no dehiscence DVT Evaluation: No evidence of DVT seen on physical exam. Negative Homan's sign. No cords or calf tenderness.   Recent Labs  06/05/14 0550 06/06/14 0625  HGB 12.1 10.2*  HCT 36.5 31.6*    Assessment/Plan: Status post Cesarean section. Doing well postoperatively.  Continue current care.  Niajah Sipos 06/07/2014, 9:08 AM

## 2014-06-07 NOTE — Progress Notes (Signed)
Patient is requesting early discharge. Since she is meeting all goals and baby has been discharged, will discharge home.    Mitchel HonourMegan Thaer Miyoshi, DO

## 2014-06-26 ENCOUNTER — Encounter (HOSPITAL_COMMUNITY): Payer: Self-pay | Admitting: Obstetrics and Gynecology

## 2014-07-24 ENCOUNTER — Other Ambulatory Visit: Payer: Self-pay | Admitting: Obstetrics & Gynecology

## 2014-07-25 LAB — CYTOLOGY - PAP

## 2016-12-26 ENCOUNTER — Other Ambulatory Visit: Payer: Self-pay | Admitting: Obstetrics & Gynecology

## 2016-12-26 DIAGNOSIS — R1011 Right upper quadrant pain: Secondary | ICD-10-CM

## 2016-12-31 ENCOUNTER — Ambulatory Visit
Admission: RE | Admit: 2016-12-31 | Discharge: 2016-12-31 | Disposition: A | Discharge status: Home / Self Care | Payer: BC Managed Care – PPO | Source: Ambulatory Visit | Attending: Obstetrics & Gynecology | Admitting: Obstetrics & Gynecology

## 2016-12-31 DIAGNOSIS — R1011 Right upper quadrant pain: Secondary | ICD-10-CM | POA: Diagnosis not present

## 2019-06-23 ENCOUNTER — Other Ambulatory Visit: Payer: Self-pay | Admitting: Orthopedic Surgery

## 2019-06-24 ENCOUNTER — Encounter
Admission: RE | Admit: 2019-06-24 | Discharge: 2019-06-24 | Disposition: A | Discharge status: Home / Self Care | Payer: BC Managed Care – PPO | Source: Ambulatory Visit | Attending: Orthopedic Surgery | Admitting: Orthopedic Surgery

## 2019-06-24 ENCOUNTER — Other Ambulatory Visit: Payer: Self-pay

## 2019-06-24 HISTORY — DX: Depression, unspecified: F32.A

## 2019-06-24 HISTORY — DX: Bipolar disorder, unspecified: F31.9

## 2019-06-24 HISTORY — DX: Personal history of urinary calculi: Z87.442

## 2019-06-24 NOTE — Patient Instructions (Addendum)
Your procedure is scheduled on: Thursday 06/30/19  Report to [place]. To find out your arrival time please call [phone] between 1PM - 3PM on Wednesday 06/29/19.   Remember: Instructions that are not followed completely may result in serious medical risk, up to and including death, or upon the discretion of your surgeon and anesthesiologist your surgery may need to be rescheduled.      _X__ 1. Do not eat food after midnight the night before your procedure.                 No gum chewing or hard candies. You may drink clear liquids up to 2 hours                 before you are scheduled to arrive for your surgery- DO NOT drink clear                 liquids within 2 hours of the start of your surgery.                 Clear Liquids include:  water, apple juice without pulp, clear carbohydrate                 drink such as Clearfast or Gatorade, Black Coffee or Tea (Do not add                 milk or creamer to coffee or tea).   __X__2.  On the morning of surgery brush your teeth with toothpaste and water, you may rinse your mouth with mouthwash if you wish.  Do not swallow any toothpaste or mouthwash.      _X__ 3.  No Alcohol for 24 hours before or after surgery.    __X__4.  Notify your doctor if there is any change in your medical condition      (cold, fever, infections).      Do not wear jewelry, make-up, hairpins, clips or nail polish. Do not wear lotions, powders, or perfumes.  Do not shave 48 hours prior to surgery. Men may shave face and neck. Do not bring valuables to the hospital.     [facility] is not responsible for any belongings or valuables.   Contacts, dentures/partials or body piercings may not be worn into surgery. Bring a case for your contacts, glasses or hearing aids, a denture cup will be supplied.    Patients discharged the day of surgery will not be allowed to drive home.    Please read over  the following fact sheets that you were given:   MRSA Information    __X__ Take these medicines the morning of surgery with A SIP OF WATER:     1. carbamazepine (TEGRETOL) 200 MG tablet  2. citalopram (CELEXA) 20 MG tablet  3. acetaminophen (TYLENOL) 500 MG tablet if needed  4.  5.  6.    __X__ Use CHG Soap as directed    __X__ Stop Anti-inflammatories 7 days before surgery such as Advil, Ibuprofen, Motrin, BC or Goodies Powder, Naprosyn, Naproxen, Aleve, Aspirin, Meloxicam. May take Tylenol if needed for pain or discomfort.     __X__ Don't start taking any new herbal supplements before your procedure.

## 2019-06-27 ENCOUNTER — Other Ambulatory Visit: Payer: Self-pay

## 2019-06-27 ENCOUNTER — Other Ambulatory Visit
Admission: RE | Admit: 2019-06-27 | Discharge: 2019-06-27 | Disposition: A | Discharge status: Home / Self Care | Payer: BC Managed Care – PPO | Source: Ambulatory Visit | Attending: Orthopedic Surgery | Admitting: Orthopedic Surgery

## 2019-06-27 DIAGNOSIS — Z20828 Contact with and (suspected) exposure to other viral communicable diseases: Secondary | ICD-10-CM | POA: Diagnosis not present

## 2019-06-27 DIAGNOSIS — Z01812 Encounter for preprocedural laboratory examination: Secondary | ICD-10-CM | POA: Diagnosis not present

## 2019-06-28 LAB — SARS CORONAVIRUS 2 (TAT 6-24 HRS): SARS Coronavirus 2: NEGATIVE

## 2019-06-30 ENCOUNTER — Ambulatory Visit: Payer: BC Managed Care – PPO | Admitting: Anesthesiology

## 2019-06-30 ENCOUNTER — Other Ambulatory Visit: Payer: Self-pay

## 2019-06-30 ENCOUNTER — Encounter
Admission: RE | Disposition: A | Discharge status: Home / Self Care | Payer: Self-pay | Source: Home / Self Care | Attending: Orthopedic Surgery

## 2019-06-30 ENCOUNTER — Ambulatory Visit
Admission: RE | Admit: 2019-06-30 | Discharge: 2019-06-30 | Disposition: A | Discharge status: Home / Self Care | Payer: BC Managed Care – PPO | Attending: Orthopedic Surgery | Admitting: Orthopedic Surgery

## 2019-06-30 ENCOUNTER — Encounter: Payer: Self-pay | Admitting: Anesthesiology

## 2019-06-30 DIAGNOSIS — M67432 Ganglion, left wrist: Secondary | ICD-10-CM | POA: Diagnosis not present

## 2019-06-30 DIAGNOSIS — Z91040 Latex allergy status: Secondary | ICD-10-CM | POA: Insufficient documentation

## 2019-06-30 DIAGNOSIS — F319 Bipolar disorder, unspecified: Secondary | ICD-10-CM | POA: Diagnosis not present

## 2019-06-30 DIAGNOSIS — Z793 Long term (current) use of hormonal contraceptives: Secondary | ICD-10-CM | POA: Insufficient documentation

## 2019-06-30 DIAGNOSIS — Z79899 Other long term (current) drug therapy: Secondary | ICD-10-CM | POA: Insufficient documentation

## 2019-06-30 HISTORY — PX: GANGLION CYST EXCISION: SHX1691

## 2019-06-30 LAB — POCT PREGNANCY, URINE: Preg Test, Ur: NEGATIVE

## 2019-06-30 SURGERY — EXCISION, GANGLION CYST, WRIST
Anesthesia: General | Site: Wrist | Laterality: Left

## 2019-06-30 MED ORDER — FENTANYL CITRATE (PF) 100 MCG/2ML IJ SOLN
INTRAMUSCULAR | Status: DC | PRN
  Administered 2019-06-30 (×2): 50 ug via INTRAVENOUS

## 2019-06-30 MED ORDER — MIDAZOLAM HCL 2 MG/2ML IJ SOLN
INTRAMUSCULAR | Status: DC | PRN
  Administered 2019-06-30 (×2): 2 mg via INTRAVENOUS

## 2019-06-30 MED ORDER — PROPOFOL 10 MG/ML IV BOLUS
INTRAVENOUS | Status: DC | PRN
  Administered 2019-06-30: 200 mg via INTRAVENOUS

## 2019-06-30 MED ORDER — BUPIVACAINE HCL 0.5 % IJ SOLN
INTRAMUSCULAR | Status: DC | PRN
  Administered 2019-06-30: 5 mL

## 2019-06-30 MED ORDER — HYDROCODONE-ACETAMINOPHEN 5-325 MG PO TABS: 1.0000 | ORAL_TABLET | Freq: Four times a day (QID) | ORAL | 0 refills | Status: DC | PRN

## 2019-06-30 MED ORDER — FAMOTIDINE 20 MG PO TABS
ORAL_TABLET | ORAL | Status: AC
  Administered 2019-06-30: 08:00:00 20 mg via ORAL
  Filled 2019-06-30: qty 1

## 2019-06-30 MED ORDER — MIDAZOLAM HCL 2 MG/2ML IJ SOLN
INTRAMUSCULAR | Status: AC
  Filled 2019-06-30: qty 2

## 2019-06-30 MED ORDER — LACTATED RINGERS IV SOLN
INTRAVENOUS | Status: DC
  Administered 2019-06-30: 08:00:00 via INTRAVENOUS

## 2019-06-30 MED ORDER — FAMOTIDINE 20 MG PO TABS
20.0000 mg | ORAL_TABLET | Freq: Once | ORAL | Status: AC
  Administered 2019-06-30: 08:00:00 20 mg via ORAL

## 2019-06-30 MED ORDER — FENTANYL CITRATE (PF) 100 MCG/2ML IJ SOLN: 25.0000 ug | INTRAMUSCULAR | Status: DC | PRN

## 2019-06-30 MED ORDER — DEXAMETHASONE SODIUM PHOSPHATE 10 MG/ML IJ SOLN
INTRAMUSCULAR | Status: DC | PRN
  Administered 2019-06-30: 10 mg via INTRAVENOUS

## 2019-06-30 MED ORDER — LIDOCAINE HCL (CARDIAC) PF 100 MG/5ML IV SOSY
PREFILLED_SYRINGE | INTRAVENOUS | Status: DC | PRN
  Administered 2019-06-30: 100 mg via INTRAVENOUS

## 2019-06-30 MED ORDER — ONDANSETRON HCL 4 MG/2ML IJ SOLN
INTRAMUSCULAR | Status: DC | PRN
  Administered 2019-06-30: 4 mg via INTRAVENOUS

## 2019-06-30 MED ORDER — ONDANSETRON HCL 4 MG/2ML IJ SOLN: 4.0000 mg | Freq: Once | INTRAMUSCULAR | Status: DC | PRN

## 2019-06-30 MED ORDER — FENTANYL CITRATE (PF) 100 MCG/2ML IJ SOLN
INTRAMUSCULAR | Status: AC
  Filled 2019-06-30: qty 2

## 2019-06-30 SURGICAL SUPPLY — 34 items
BNDG ELASTIC 3X5.8 VLCR NS LF (GAUZE/BANDAGES/DRESSINGS) ×3 IMPLANT
CANISTER SUCT 1200ML W/VALVE (MISCELLANEOUS) ×3 IMPLANT
CAST PADDING 3X4FT ST 30246 (SOFTGOODS) ×2
CHLORAPREP W/TINT 26 (MISCELLANEOUS) ×3 IMPLANT
COVER WAND RF STERILE (DRAPES) ×3 IMPLANT
CUFF TOURN SGL QUICK 18X4 (TOURNIQUET CUFF) ×2 IMPLANT
DERMABOND ADVANCED (GAUZE/BANDAGES/DRESSINGS) ×2
DERMABOND ADVANCED .7 DNX12 (GAUZE/BANDAGES/DRESSINGS) IMPLANT
DRSG TELFA 3X8 NADH (GAUZE/BANDAGES/DRESSINGS) ×3 IMPLANT
ELECT CAUTERY NDL 2.0 MIC (NEEDLE) ×1 IMPLANT
ELECT CAUTERY NEEDLE 2.0 MIC (NEEDLE) ×3 IMPLANT
GAUZE SPONGE 4X4 12PLY STRL (GAUZE/BANDAGES/DRESSINGS) ×3 IMPLANT
GAUZE XEROFORM 1X8 LF (GAUZE/BANDAGES/DRESSINGS) ×3 IMPLANT
GLOVE SURG SYN 9.0  PF PI (GLOVE) ×2
GLOVE SURG SYN 9.0 PF PI (GLOVE) ×1 IMPLANT
GOWN SRG 2XL LVL 4 RGLN SLV (GOWNS) ×1 IMPLANT
GOWN STRL NON-REIN 2XL LVL4 (GOWNS) ×2
GOWN STRL REUS W/ TWL LRG LVL3 (GOWN DISPOSABLE) ×1 IMPLANT
GOWN STRL REUS W/TWL LRG LVL3 (GOWN DISPOSABLE) ×2
KIT TURNOVER KIT A (KITS) ×3 IMPLANT
NS IRRIG 500ML POUR BTL (IV SOLUTION) ×3 IMPLANT
PACK EXTREMITY ARMC (MISCELLANEOUS) ×3 IMPLANT
PAD CAST CTTN 3X4 STRL (SOFTGOODS) ×1 IMPLANT
PAD DRESSING TELFA 3X8 NADH (GAUZE/BANDAGES/DRESSINGS) IMPLANT
SCALPEL PROTECTED #15 DISP (BLADE) ×6 IMPLANT
SPLINT CAST 1 STEP 3X12 (MISCELLANEOUS) ×3 IMPLANT
SUT ETHILON 4-0 (SUTURE) ×2
SUT ETHILON 4-0 FS2 18XMFL BLK (SUTURE) ×1
SUT ETHILON 5-0 FS-2 18 BLK (SUTURE) ×3 IMPLANT
SUT MNCRL 4-0 (SUTURE) ×2
SUT MNCRL 4-018XMFL (SUTURE) ×1
SUT MNCRL AB 4-0 PS2 18 (SUTURE) ×2 IMPLANT
SUTURE ETHLN 4-0 FS2 18XMF BLK (SUTURE) ×1 IMPLANT
SUTURE MNCRL 4-018XMF (SUTURE) ×1 IMPLANT

## 2019-06-30 NOTE — Op Note (Signed)
06/30/2019  9:31 AM  PATIENT:  Lavonne Chick  33 y.o. female  PRE-OPERATIVE DIAGNOSIS:  GANGLION CYST LEFT WRIST  POST-OPERATIVE DIAGNOSIS:  GANGLION CYST LEFT WRIST  PROCEDURE:  Procedure(s): REMOVAL GANGLION OF WRIST (Left)  SURGEON: Laurene Footman, MD  ASSISTANTS: None  ANESTHESIA:   general  EBL:  Total I/O In: 300 [I.V.:300] Out: -   BLOOD ADMINISTERED:none  DRAINS: none   LOCAL MEDICATIONS USED:  MARCAINE     SPECIMEN:  No Specimen  DISPOSITION OF SPECIMEN:  N/A  COUNTS:  YES  TOURNIQUET:   Total Tourniquet Time Documented: Upper Arm (Left) - 18 minutes Total: Upper Arm (Left) - 18 minutes   IMPLANTS: None  DICTATION: .Dragon Dictation patient was brought to the operating room and after adequate anesthesia was obtained appropriate patient identification and timeout procedures were completed.  Tourniquet was raised and transverse incision made over the dorsal radial wrist with subcutaneous tissue spread.  Retractors were placed and palpable knot was identified under the extensor retinaculum which was then split.  Ganglion cyst was identified and it appeared to impinge on the EPL tendon which was protected.  The inflammatory tissue adjacent to the tendon was debrided along with the cyst and the cyst tracked down to the dorsal joint where cautery was used to try to cause scar tissue as well as using a small elevator to abrade the tissue to try to prevent recurrence.  After checking adjacent tissue to make sure there is no further mass noted the wound was thoroughly irrigated and 5 cc of half percent Sensorcaine infiltrated approximately for postop analgesia.  4-0 Monocryl was used subcuticularly followed by Dermabond.  When this was dry Telfa was applied over the dorsum of the wrist followed by 4 x 4's web roll and a volar splint followed by an Ace wrap  PLAN OF CARE: Discharge to home after PACU  PATIENT DISPOSITION:  PACU - hemodynamically stable.

## 2019-06-30 NOTE — Anesthesia Post-op Follow-up Note (Signed)
Anesthesia QCDR form completed.        

## 2019-06-30 NOTE — Anesthesia Preprocedure Evaluation (Signed)
Anesthesia Evaluation  Patient identified by MRN, date of birth, ID band Patient awake    Reviewed: Allergy & Precautions, H&P , Patient's Chart, lab work & pertinent test results  Airway Mallampati: II  TM Distance: >3 FB Neck ROM: full    Dental   Pulmonary neg pulmonary ROS,    breath sounds clear to auscultation       Cardiovascular negative cardio ROS   Rhythm:regular Rate:Normal     Neuro/Psych PSYCHIATRIC DISORDERS Depression Bipolar Disorder negative neurological ROS     GI/Hepatic negative GI ROS, Neg liver ROS,   Endo/Other  negative endocrine ROS  Renal/GU negative Renal ROS  negative genitourinary   Musculoskeletal negative musculoskeletal ROS (+)   Abdominal   Peds negative pediatric ROS (+)  Hematology negative hematology ROS (+)   Anesthesia Other Findings Past Medical History: No date: Bipolar disorder ([place]) No date: Depression No date: History of kidney stones  Reproductive/Obstetrics                             Anesthesia Physical  Anesthesia Plan  ASA: II  Anesthesia Plan: General   Post-op Pain Management:    Induction: Intravenous  PONV Risk Score and Plan:   Airway Management Planned: LMA  Additional Equipment:   Intra-op Plan:   Post-operative Plan: Extubation in OR  Informed Consent: I have reviewed the patients History and Physical, chart, labs and discussed the procedure including the risks, benefits and alternatives for the proposed anesthesia with the patient or authorized representative who has indicated his/her understanding and acceptance.     Dental advisory given  Plan Discussed with: CRNA and Surgeon  Anesthesia Plan Comments:         Anesthesia Quick Evaluation

## 2019-06-30 NOTE — Transfer of Care (Signed)
Immediate Anesthesia Transfer of Care Note  Patient: Gina Gilbert  Procedure(s) Performed: REMOVAL GANGLION OF WRIST (Left Wrist)  Patient Location: PACU  Anesthesia Type:General  Level of Consciousness: sedated  Airway & Oxygen Therapy: Patient Spontanous Breathing and Patient connected to face mask oxygen  Post-op Assessment: Report given to RN and Post -op Vital signs reviewed and stable  Post vital signs: Reviewed and stable  Last Vitals:  Vitals Value Taken Time  BP    Temp    Pulse    Resp    SpO2      Last Pain:  Vitals:   06/30/19 0726  TempSrc: Oral  PainSc: 0-No pain         Complications: No apparent anesthesia complications

## 2019-06-30 NOTE — Discharge Instructions (Addendum)
Work on finger motion as tolerated.  Keep arm elevated.  Ice to the back of the wrist may help with pain.  Loosen Ace wrap if fingers swell.  Pain medicine as directed.  AMBULATORY SURGERY  DISCHARGE INSTRUCTIONS   1) The drugs that you were given will stay in your system until tomorrow so for the next 24 hours you should not:  A) Drive an automobile B) Make any legal decisions C) Drink any alcoholic beverage   2) You may resume regular meals tomorrow.  Today it is better to start with liquids and gradually work up to solid foods.  You may eat anything you prefer, but it is better to start with liquids, then soup and crackers, and gradually work up to solid foods.   3) Please notify your doctor immediately if you have any unusual bleeding, trouble breathing, redness and pain at the surgery site, drainage, fever, or pain not relieved by medication.    4) Additional Instructions:        Please contact your physician with any problems or Same Day Surgery at [phone], Monday through Friday 6 am to 4 pm, or [place] at [facility] number at [phone].

## 2019-06-30 NOTE — Anesthesia Procedure Notes (Signed)
Procedure Name: LMA Insertion Date/Time: 06/30/2019 8:56 AM Performed by: Nelda Marseille, CRNA Pre-anesthesia Checklist: Patient identified, Patient being monitored, Timeout performed, Emergency Drugs available and Suction available Patient Re-evaluated:Patient Re-evaluated prior to induction Oxygen Delivery Method: Circle system utilized Preoxygenation: Pre-oxygenation with 100% oxygen Induction Type: IV induction Ventilation: Mask ventilation without difficulty LMA: LMA inserted LMA Size: 3.5 Tube type: Oral Number of attempts: 1 Placement Confirmation: positive ETCO2 and breath sounds checked- equal and bilateral Tube secured with: Tape Dental Injury: Teeth and Oropharynx as per pre-operative assessment

## 2019-06-30 NOTE — Anesthesia Postprocedure Evaluation (Signed)
Anesthesia Post Note  Patient: Gina Gilbert  Procedure(s) Performed: REMOVAL GANGLION OF WRIST (Left Wrist)  Patient location during evaluation: PACU Anesthesia Type: General Level of consciousness: awake and alert and oriented Pain management: pain level controlled Vital Signs Assessment: post-procedure vital signs reviewed and stable Respiratory status: spontaneous breathing Cardiovascular status: blood pressure returned to baseline Anesthetic complications: no     Last Vitals:  Vitals:   06/30/19 1013 06/30/19 1021  BP: 121/81 120/73  Pulse: 73 78  Resp: 18 18  Temp:  36.6 C  SpO2: 100% 100%    Last Pain:  Vitals:   06/30/19 1021  TempSrc:   PainSc: 0-No pain                 Zenith Kercheval

## 2019-06-30 NOTE — H&P (Signed)
Reviewed paper H+P, will be scanned into chart. No changes noted.  

## 2019-07-01 ENCOUNTER — Encounter: Payer: Self-pay | Admitting: Orthopedic Surgery

## 2021-02-11 ENCOUNTER — Other Ambulatory Visit: Payer: Self-pay

## 2021-02-11 ENCOUNTER — Other Ambulatory Visit: Payer: Self-pay | Admitting: Family Medicine

## 2021-02-11 ENCOUNTER — Ambulatory Visit
Admission: RE | Admit: 2021-02-11 | Discharge: 2021-02-11 | Disposition: A | Discharge status: Home / Self Care | Payer: BC Managed Care – PPO | Source: Ambulatory Visit | Attending: Family Medicine | Admitting: Family Medicine

## 2021-02-11 DIAGNOSIS — R42 Dizziness and giddiness: Secondary | ICD-10-CM | POA: Diagnosis present

## 2021-02-11 DIAGNOSIS — S0990XA Unspecified injury of head, initial encounter: Secondary | ICD-10-CM | POA: Diagnosis present

## 2022-01-03 IMAGING — CT CT HEAD W/O CM
3 series · 16 of 47 positions shown, 19 images · non-contrast
Comparison: None.

CLINICAL DATA: Dizziness, syncope, passed out and hit head

EXAM:
CT HEAD WITHOUT CONTRAST
TECHNIQUE: Contiguous axial images were obtained from the base of the skull
through the vertex without intravenous contrast.

[Series 2: head wo · axial · 0.44mm/px · z∈[-160,-30]mm · 10 of 32 slices shown, 13 images]
[im 3/32  brain]
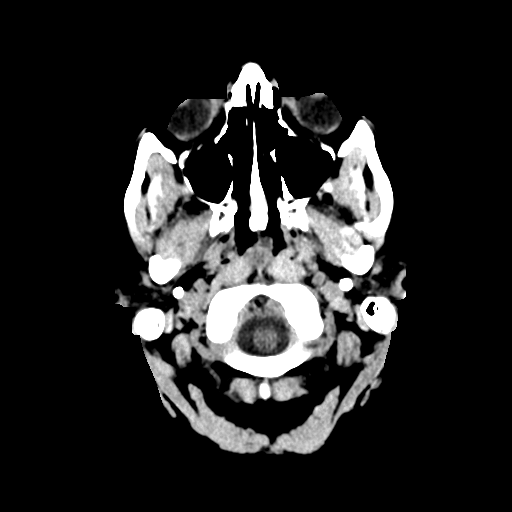
[im 3/32  bone]
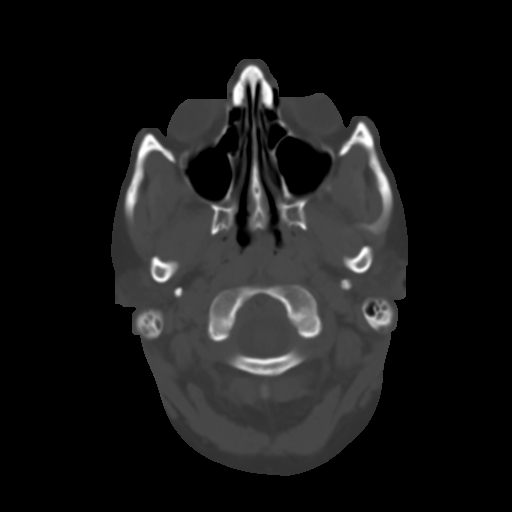
[im 6/32  brain]
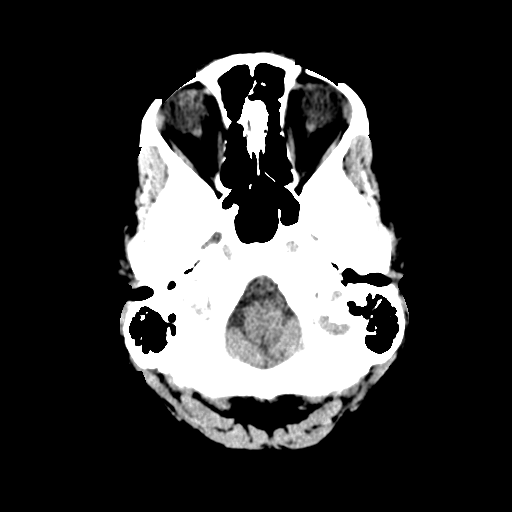
[im 9/32  brain]
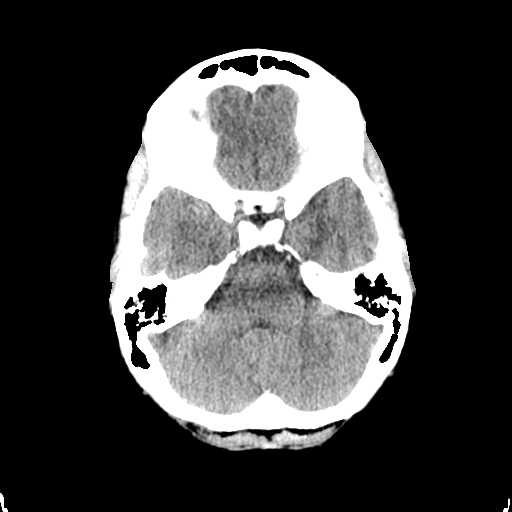
[im 11/32  brain]
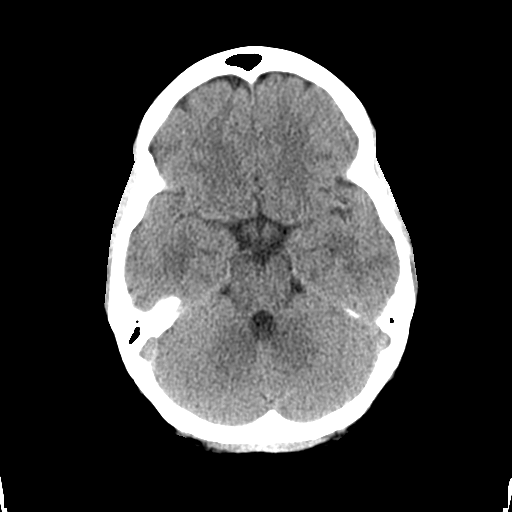
[im 14/32  brain]
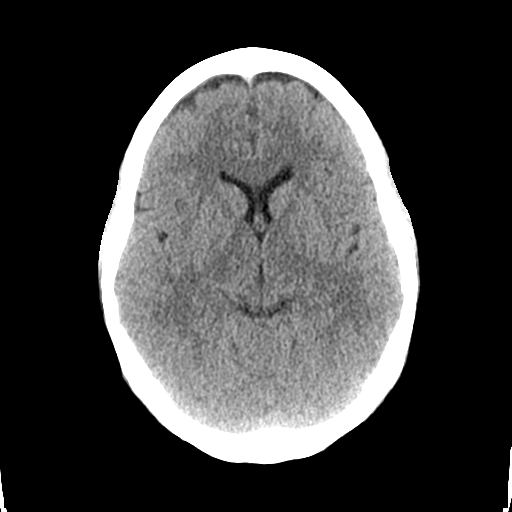
[im 14/32  bone]
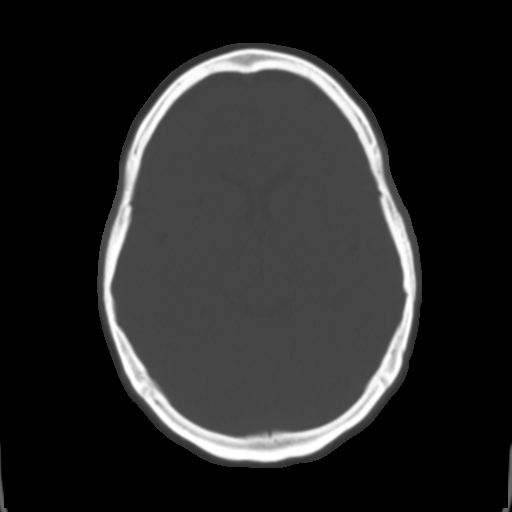
[im 18/32  brain]
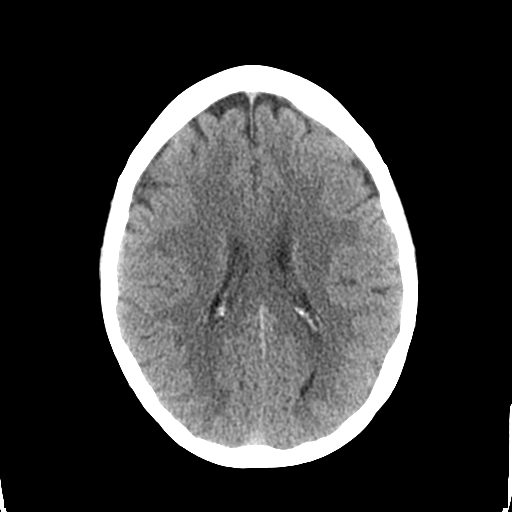
[im 21/32  brain]
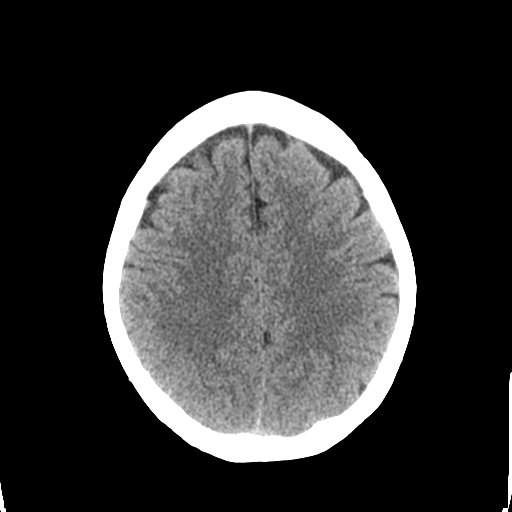
[im 24/32  brain]
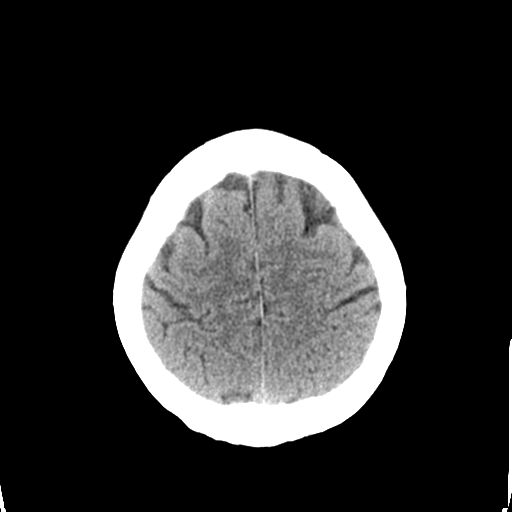
[im 26/32  brain]
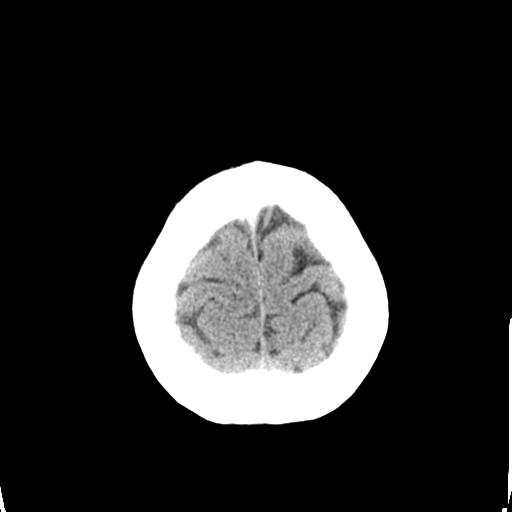
[im 26/32  bone]
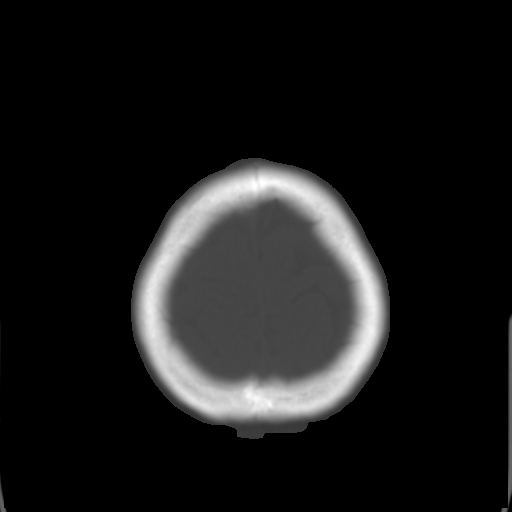
[im 29/32  brain]
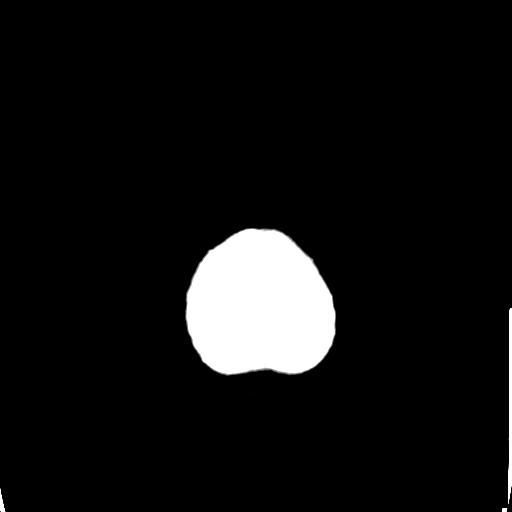

[Series 4: coronal soft tissue · coronal · 0.32mm/px · 3 of 69 slices shown]
[im 23/69  brain]
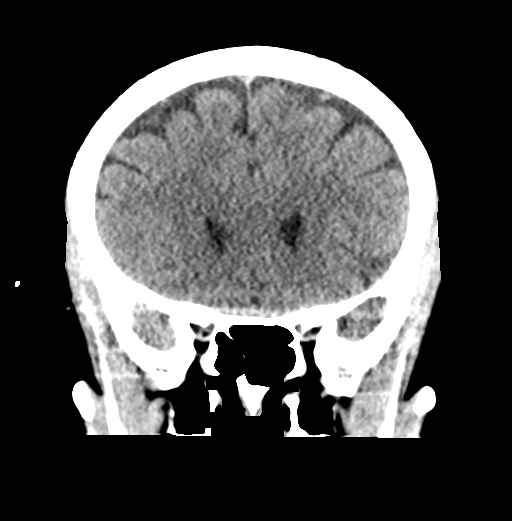
[im 31/69  brain]
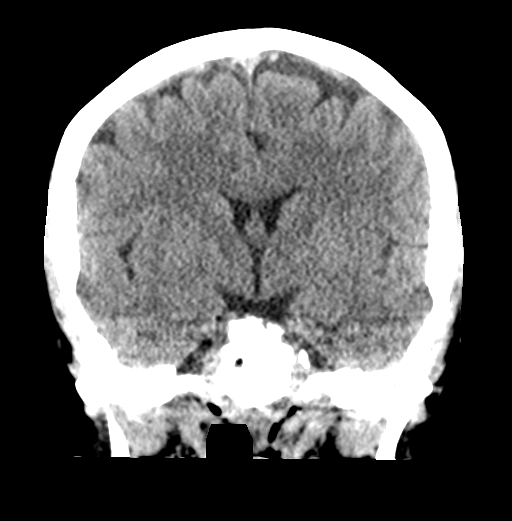
[im 38/69  brain]
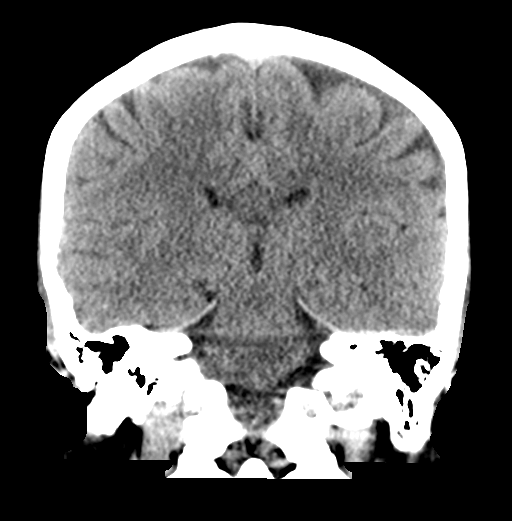

[Series 5: sagittal soft tissue · sagittal · 0.35mm/px · 3 of 56 slices shown]
[im 19/56  brain]
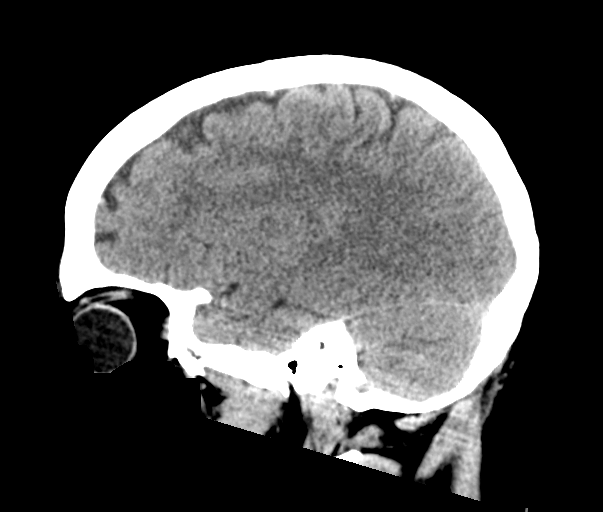
[im 28/56  brain]
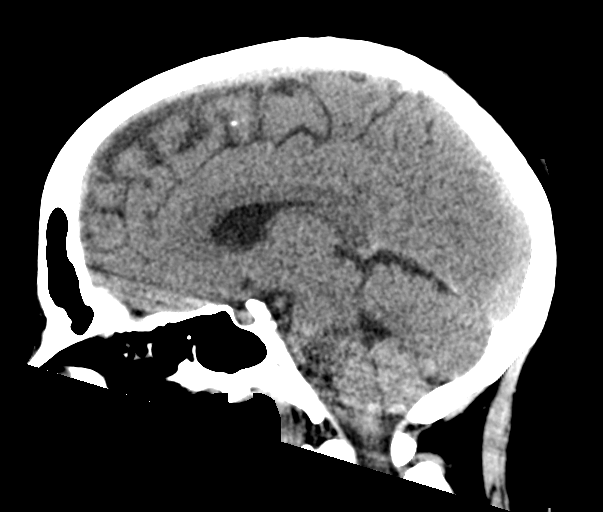
[im 37/56  brain]
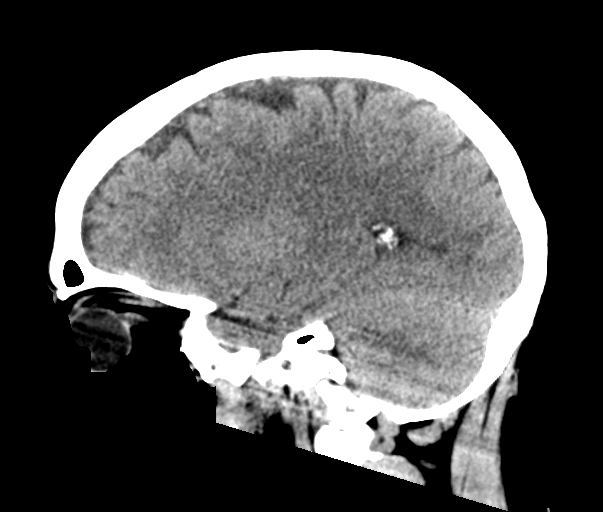

[16 of 47 positions shown; findings below may reference images not displayed]

FINDINGS: Brain: No evidence of acute infarction, hemorrhage, hydrocephalus,
extra-axial collection or mass lesion/mass effect.

Vascular: No hyperdense vessel or unexpected calcification.

Skull: Normal. Negative for fracture or focal lesion.

Sinuses/Orbits: No acute finding.

Other: None.
IMPRESSION: No acute intracranial abnormality.

## 2022-01-14 NOTE — Progress Notes (Signed)
Surgical Instructions    Your procedure is scheduled on Wednesday, May 31st, 2023.   Report to [facility] Main Entrance "A" at 05:30 A.M., then check in with the Admitting office.  Call this number if you have problems the morning of surgery:  [phone]   If you have any questions prior to your surgery date call [phone]: Open Monday-Friday 8am-4pm    Remember:  Do not eat or drink after midnight the night before your surgery    Take these medicines the morning of surgery with A SIP OF WATER:   If needed:  acetaminophen (TYLENOL) carbamazepine (TEGRETOL) citalopram (CELEXA)  As of today, STOP taking any Aspirin (unless otherwise instructed by your surgeon) Aleve, Naproxen, Ibuprofen, Motrin, Advil, Goody's, BC's, all herbal medications, fish oil, and all vitamins.    The day of surgery:          Do not wear jewelry or makeup Do not wear lotions, powders, perfumes, or deodorant. Do not shave 48 hours prior to surgery.   Do not bring valuables to the hospital. Do not wear nail polish, gel polish, artificial nails, or any other type of covering on natural nails (fingers and toes) If you have artificial nails or gel coating that need to be removed by a nail salon, please have this removed prior to surgery. Artificial nails or gel coating may interfere with anesthesia's ability to adequately monitor your vital signs.   [place] is not responsible for any belongings or valuables. .   Do NOT Smoke (Tobacco/Vaping)  24 hours prior to your procedure  If you use a CPAP at night, you may bring your mask for your overnight stay.   Contacts, glasses, hearing aids, dentures or partials may not be worn into surgery, please bring cases for these belongings   For patients admitted to the hospital, discharge time will be determined by your treatment team.   Patients discharged the day of surgery will not be allowed to drive home, and someone needs to stay with them for 24  hours.   SURGICAL WAITING ROOM VISITATION Patients having surgery or a procedure in a hospital may have two support people. Children under the age of 58 must have an adult with them who is not the patient. They may stay in the waiting area during the procedure and may switch out with other visitors. If the patient needs to stay at the hospital during part of their recovery, the visitor guidelines for inpatient rooms apply.  Please refer to the [name] website for the visitor guidelines for Inpatients (after your surgery is over and you are in a regular room).    Special instructions:    Oral Hygiene is also important to reduce your risk of infection.  Remember - BRUSH YOUR TEETH THE MORNING OF SURGERY WITH YOUR REGULAR TOOTHPASTE   [place]- Preparing For Surgery  Before surgery, you can play an important role. Because skin is not sterile, your skin needs to be as free of germs as possible. You can reduce the number of germs on your skin by washing with CHG (chlorahexidine gluconate) Soap before surgery.  CHG is an antiseptic cleaner which kills germs and bonds with the skin to continue killing germs even after washing.     Please do not use if you have an allergy to CHG or antibacterial soaps. If your skin becomes reddened/irritated stop using the CHG.  Do not shave (including legs and underarms) for at least 48 hours prior to first CHG  shower. It is OK to shave your face.  Please follow these instructions carefully.     Shower the NIGHT BEFORE SURGERY and the MORNING OF SURGERY with CHG Soap.   If you chose to wash your hair, wash your hair first as usual with your normal shampoo. After you shampoo, rinse your hair and body thoroughly to remove the shampoo.  Then [employer] and genitals (private parts) with your normal soap and rinse thoroughly to remove soap.  After that Use CHG Soap as you would any other liquid soap. You can apply CHG directly to the skin and wash gently with  a scrungie or a clean washcloth.   Apply the CHG Soap to your body ONLY FROM THE NECK DOWN.  Do not use on open wounds or open sores. Avoid contact with your eyes, ears, mouth and genitals (private parts). Wash Face and genitals (private parts)  with your normal soap.   Wash thoroughly, paying special attention to the area where your surgery will be performed.  Thoroughly rinse your body with warm water from the neck down.  DO NOT shower/wash with your normal soap after using and rinsing off the CHG Soap.  Pat yourself dry with a CLEAN TOWEL.  Wear CLEAN PAJAMAS to bed the night before surgery  Place CLEAN SHEETS on your bed the night before your surgery  DO NOT SLEEP WITH PETS.   Day of Surgery:  Take a shower with CHG soap. Wear Clean/Comfortable clothing the morning of surgery Do not apply any deodorants/lotions.   Remember to brush your teeth WITH YOUR REGULAR TOOTHPASTE.    If you received a COVID test during your pre-op visit, it is requested that you wear a mask when out in public, stay away from anyone that may not be feeling well, and notify your surgeon if you develop symptoms. If you have been in contact with anyone that has tested positive in the last 10 days, please notify your surgeon.    Please read over the following fact sheets that you were given.

## 2022-01-15 ENCOUNTER — Other Ambulatory Visit: Payer: Self-pay

## 2022-01-15 ENCOUNTER — Encounter (HOSPITAL_COMMUNITY): Payer: Self-pay

## 2022-01-15 ENCOUNTER — Encounter (HOSPITAL_COMMUNITY)
Admission: RE | Admit: 2022-01-15 | Discharge: 2022-01-15 | Disposition: A | Discharge status: Home / Self Care | Payer: BC Managed Care – PPO | Source: Ambulatory Visit | Attending: Obstetrics & Gynecology | Admitting: Obstetrics & Gynecology

## 2022-01-15 DIAGNOSIS — N946 Dysmenorrhea, unspecified: Secondary | ICD-10-CM | POA: Diagnosis not present

## 2022-01-15 DIAGNOSIS — R55 Syncope and collapse: Secondary | ICD-10-CM | POA: Diagnosis not present

## 2022-01-15 DIAGNOSIS — Z01812 Encounter for preprocedural laboratory examination: Secondary | ICD-10-CM | POA: Diagnosis present

## 2022-01-15 HISTORY — DX: Anxiety disorder, unspecified: F41.9

## 2022-01-15 HISTORY — DX: Syncope and collapse: R55

## 2022-01-15 LAB — CBC
HCT: 43.1 % (ref 36.0–46.0)
Hemoglobin: 13.6 g/dL (ref 12.0–15.0)
MCH: 29.7 pg (ref 26.0–34.0)
MCHC: 31.6 g/dL (ref 30.0–36.0)
MCV: 94.1 fL (ref 80.0–100.0)
Platelets: 209 10*3/uL (ref 150–400)
RBC: 4.58 MIL/uL (ref 3.87–5.11)
RDW: 12 % (ref 11.5–15.5)
WBC: 5.9 10*3/uL (ref 4.0–10.5)
nRBC: 0 % (ref 0.0–0.2)

## 2022-01-15 LAB — COMPREHENSIVE METABOLIC PANEL
ALT: 17 U/L (ref 0–44)
AST: 17 U/L (ref 15–41)
Albumin: 3.9 g/dL (ref 3.5–5.0)
Alkaline Phosphatase: 79 U/L (ref 38–126)
Anion gap: 8 (ref 5–15)
BUN: 6 mg/dL (ref 6–20)
CO2: 27 mmol/L (ref 22–32)
Calcium: 8.8 mg/dL — ABNORMAL LOW (ref 8.9–10.3)
Chloride: 106 mmol/L (ref 98–111)
Creatinine, Ser: 0.91 mg/dL (ref 0.44–1.00)
GFR, Estimated: >60 mL/min (ref >60)
Glucose, Bld: 112 mg/dL — ABNORMAL HIGH (ref 70–99)
Potassium: 3.5 mmol/L (ref 3.5–5.1)
Sodium: 141 mmol/L (ref 135–145)
Total Bilirubin: 0.4 mg/dL (ref 0.3–1.2)
Total Protein: 6.8 g/dL (ref 6.5–8.1)

## 2022-01-15 LAB — TYPE AND SCREEN
ABO/RH(D): A POS
Antibody Screen: NEGATIVE

## 2022-01-15 NOTE — Progress Notes (Addendum)
Anesthesia Chart Review:  36 year old female with long history of vasovagal syncope.  Patient states this generally occurs during her period.  Last episode ~6 months ago.  Syncopal episodes are generally preceded by severe abdominal cramping/pain, bowel movement, or emesis.  Syncopal episodes are brief, lasting only seconds, and she reports she is generally "back to normal" when she regains consciousness.  She states that her father also has vasovagal syncopal episodes with pain. Symptoms do not occur with exertion.  She is able to perform well over 4 METS without issue.  She denies any history of chest pain, shortness of breath, cardiac disease.  She says she has been seeing her OB/GYN Dr. Langston Masker for many years and working on this issue.  She has exhausted pharmacological options for reducing discomfort during her period.  She states that Dr. Langston Masker ultimately recommended hysterectomy to try to improve the symptoms.  She had previous general anesthesia 06/30/2019 for ganglion cyst removal without complication.  Preop labs reviewed, unremarkable..   Reviewed medical history with anesthesiologist Dr. Salvadore Farber. Advised ok to proceed as planned barring acute status change.    Gina Gilbert [facility] Short Stay [facility]/Anesthesiology Phone [phone] 01/15/2022 3:39 PM

## 2022-01-15 NOTE — Anesthesia Preprocedure Evaluation (Addendum)
Anesthesia Evaluation  Patient identified by MRN, date of birth, ID band Patient awake    Reviewed: Allergy & Precautions, NPO status , Patient's Chart, lab work & pertinent test results  History of Anesthesia Complications Negative for: history of anesthetic complications  Airway Mallampati: II  TM Distance: >3 FB Neck ROM: Full    Dental  (+) Teeth Intact, Dental Advisory Given   Pulmonary neg pulmonary ROS,    Pulmonary exam normal        Cardiovascular Exercise Tolerance: Good Normal cardiovascular exam  Frequent vasovagal syncopal episodes   Neuro/Psych Anxiety Depression Bipolar Disorder negative neurological ROS     GI/Hepatic negative GI ROS, Neg liver ROS,   Endo/Other  negative endocrine ROS  Renal/GU negative Renal ROS  negative genitourinary   Musculoskeletal negative musculoskeletal ROS (+)   Abdominal   Peds  Hematology negative hematology ROS (+)   Anesthesia Other Findings   Reproductive/Obstetrics                            Anesthesia Physical Anesthesia Plan  ASA: 2  Anesthesia Plan: General   Post-op Pain Management: Tylenol PO (pre-op)* and Toradol IV (intra-op)*   Induction: Intravenous  PONV Risk Score and Plan: 4 or greater and Ondansetron, Dexamethasone, Treatment may vary due to age or medical condition, Midazolam and Scopolamine patch - Pre-op  Airway Management Planned: Oral ETT  Additional Equipment: None  Intra-op Plan:   Post-operative Plan: Extubation in OR  Informed Consent: I have reviewed the patients History and Physical, chart, labs and discussed the procedure including the risks, benefits and alternatives for the proposed anesthesia with the patient or authorized representative who has indicated his/her understanding and acceptance.     Dental advisory given  Plan Discussed with:   Anesthesia Plan Comments: (PAT note by Karoline Caldwell,  PA-C: 36 year old female with long history of vasovagal syncope.  Patient states this generally occurs during her period.  Last episode ~6 months ago.  Syncopal episodes are generally preceded by severe abdominal cramping/pain, bowel movement, or emesis.  Syncopal episodes are brief, lasting only seconds, and she reports she is generally "back to normal" when she regains consciousness.  She states that her father also has vasovagal syncopal episodes with pain. Symptoms do not occur with exertion.  She is able to perform well over 4 METS without issue.  She denies any history of chest pain, shortness of breath, cardiac disease.  She says she has been seeing her OB/Gyn Dr. Lynnette Caffey for many years and working on this issue.  She has exhausted pharmacological options for reducing discomfort during her period.  She states that Dr. Lynnette Caffey ultimately recommended hysterectomy to try to improve the symptoms.  She had previous general anesthesia 06/30/2019 for ganglion cyst removal without complication.  Preop labs reviewed, unremarkable  Reviewed medical history with anesthesiologist Dr. Doroteo Glassman. Advised ok to proceed as planned barring acute status change.  )      Anesthesia Quick Evaluation

## 2022-01-15 NOTE — Progress Notes (Addendum)
PCP - Marisue Ivan,  Cardiologist - denies  PPM/ICD - denies Chest x-ray - N/A EKG - N/A- per anesthesia APP, not needed at this time Stress Test - denies ECHO - denies Cardiac Cath - denies  Sleep Study - denies Pt does not take blood thinners or aspirin.  ERAS Protcol - NPO order COVID TEST- N/A  Anesthesia review: Patient reports that she "passes out" when she is on her period. Pt reports this happened monthly when she was in high school, but has happened twice in the last 7 years since her daughter was born. Pt reports she last passed out 6 months ago, which prompted her to schedule this surgery. Pt reports that she has nausea before/after passing out. Pt states her MD's are aware of this and has no further cardiac workup for this. Pt reports she had an EKG done "years" ago that was normal. Antionette Poles, PA notified of these episodes. No new orders while at PAT appointment.   Patient denies shortness of breath, fever, cough and chest pain at PAT appointment   All instructions explained to the patient, with a verbal understanding of the material. Patient agrees to go over the instructions while at home for a better understanding. The opportunity to ask questions was provided.

## 2022-01-17 ENCOUNTER — Encounter (HOSPITAL_COMMUNITY): Admission: RE | Admit: 2022-01-17 | Payer: BC Managed Care – PPO | Source: Ambulatory Visit

## 2022-01-21 NOTE — H&P (Signed)
Gina Gilbert is an 36 y.o. female G1P1 with dysmenorrhea, menorrhagia and catamenial syncope since age 32.  She has tried multiple hormonal medications to include IUD which caused cystic acne.  She currently is taking continuous OCPs and worries this is causing weight gain and mood changes.  She has definitively completed child bearing.  Previous C/S x 1.  Patient desires definitive treatment.  Pertinent Gynecological History: Menses:  amenorrhea on continuous OCPs Bleeding: none Contraception: OCP (estrogen/progesterone) DES exposure: unknown Blood transfusions: none Sexually transmitted diseases: no past history Previous GYN Procedures:  C/S   Last mammogram:  n/a  Date: n/a Last pap: normal Date: 09/25/20 OB History: G1, P1   Menstrual History: Menarche age: n/a No LMP recorded.    Past Medical History:  Diagnosis Date   Anxiety    Bipolar disorder (HCC)    Depression    History of kidney stones    Syncope    pt reports she passes out when she is on her period- since she was 32.    Past Surgical History:  Procedure Laterality Date   ADENOIDECTOMY     CESAREAN SECTION N/A 06/05/2014   Procedure: CESAREAN SECTION;  Surgeon: Leslie Andrea, MD;  Location: [facility];  Service: Obstetrics;  Laterality: N/A;   GANGLION CYST EXCISION Left 06/30/2019   Procedure: REMOVAL GANGLION OF WRIST;  Surgeon: Kennedy Bucker, MD;  Location: [facility];  Service: Orthopedics;  Laterality: Left;   TONSILLECTOMY     WISDOM TOOTH EXTRACTION  2002    No family history on file.  Social History:  reports that she has never smoked. She has never used smokeless tobacco. She reports that she does not currently use alcohol after a past usage of about 1.0 standard drink per week. She reports that she does not use drugs.  Allergies:  Allergies  Allergen Reactions   Latex Swelling   Stadol [Butorphanol]     "Lose my mind"    No medications prior to admission.    Review of Systems  unknown  if currently breastfeeding. Physical Exam Constitutional:      Appearance: Normal appearance.  HENT:     Head: Normocephalic and atraumatic.  Pulmonary:     Effort: Pulmonary effort is normal.  Abdominal:     Palpations: Abdomen is soft.  Musculoskeletal:        General: Normal range of motion.     Cervical back: Normal range of motion.  Skin:    General: Skin is warm and dry.  Neurological:     Mental Status: She is alert and oriented to person, place, and time.  Psychiatric:        Mood and Affect: Mood normal.        Behavior: Behavior normal.    No results found for this or any previous visit (from the past 24 hour(s)).  No results found.  Assessment/Plan: 36 yo G1P1 with dysmenorrhea, menorrhagia -LAVH, BS -Patient is counseled re: risk of bleeding, infection, scarring and damage to surrounding structures.  She is informed of the steps of the procedure as well as postop expectations.  All questions were answered and patient wishes to proceed.  Gina Gilbert 01/21/2022, 7:05 PM

## 2022-01-22 ENCOUNTER — Observation Stay (HOSPITAL_COMMUNITY): Payer: BC Managed Care – PPO | Admitting: Physician Assistant

## 2022-01-22 ENCOUNTER — Observation Stay (HOSPITAL_COMMUNITY): Payer: BC Managed Care – PPO | Admitting: Anesthesiology

## 2022-01-22 ENCOUNTER — Observation Stay (HOSPITAL_COMMUNITY)
Admission: RE | Admit: 2022-01-22 | Discharge: 2022-01-23 | Disposition: A | Discharge status: Home / Self Care | Payer: BC Managed Care – PPO | Attending: Obstetrics & Gynecology | Admitting: Obstetrics & Gynecology

## 2022-01-22 ENCOUNTER — Encounter (HOSPITAL_COMMUNITY): Payer: Self-pay | Admitting: Obstetrics & Gynecology

## 2022-01-22 ENCOUNTER — Other Ambulatory Visit: Payer: Self-pay

## 2022-01-22 ENCOUNTER — Encounter (HOSPITAL_COMMUNITY)
Admission: RE | Disposition: A | Discharge status: Home / Self Care | Payer: Self-pay | Source: Home / Self Care | Attending: Obstetrics & Gynecology

## 2022-01-22 DIAGNOSIS — N879 Dysplasia of cervix uteri, unspecified: Secondary | ICD-10-CM | POA: Diagnosis not present

## 2022-01-22 DIAGNOSIS — Z90710 Acquired absence of both cervix and uterus: Secondary | ICD-10-CM | POA: Diagnosis present

## 2022-01-22 DIAGNOSIS — N946 Dysmenorrhea, unspecified: Secondary | ICD-10-CM | POA: Diagnosis present

## 2022-01-22 DIAGNOSIS — N888 Other specified noninflammatory disorders of cervix uteri: Principal | ICD-10-CM | POA: Insufficient documentation

## 2022-01-22 DIAGNOSIS — Z91040 Latex allergy status: Secondary | ICD-10-CM | POA: Diagnosis not present

## 2022-01-22 DIAGNOSIS — N920 Excessive and frequent menstruation with regular cycle: Secondary | ICD-10-CM | POA: Diagnosis present

## 2022-01-22 HISTORY — PX: LAPAROSCOPIC VAGINAL HYSTERECTOMY WITH SALPINGECTOMY: SHX6680

## 2022-01-22 LAB — POCT PREGNANCY, URINE: Preg Test, Ur: NEGATIVE

## 2022-01-22 SURGERY — HYSTERECTOMY, VAGINAL, LAPAROSCOPY-ASSISTED, WITH SALPINGECTOMY
Anesthesia: General | Laterality: Bilateral

## 2022-01-22 MED ORDER — CITALOPRAM HYDROBROMIDE 20 MG PO TABS: 20.0000 mg | ORAL_TABLET | Freq: Every day | ORAL | Status: DC

## 2022-01-22 MED ORDER — ORAL CARE MOUTH RINSE: 15.0000 mL | Freq: Once | OROMUCOSAL | Status: AC

## 2022-01-22 MED ORDER — MENTHOL 3 MG MT LOZG: 1.0000 | LOZENGE | OROMUCOSAL | Status: DC | PRN

## 2022-01-22 MED ORDER — ROCURONIUM BROMIDE 10 MG/ML (PF) SYRINGE
PREFILLED_SYRINGE | INTRAVENOUS | Status: DC | PRN
  Administered 2022-01-22: 10 mg via INTRAVENOUS
  Administered 2022-01-22: 70 mg via INTRAVENOUS
  Administered 2022-01-22: 10 mg via INTRAVENOUS

## 2022-01-22 MED ORDER — CHLORHEXIDINE GLUCONATE 0.12 % MT SOLN
OROMUCOSAL | Status: AC
  Administered 2022-01-22: 15 mL via OROMUCOSAL
  Filled 2022-01-22: qty 15

## 2022-01-22 MED ORDER — SIMETHICONE 80 MG PO CHEW: 80.0000 mg | CHEWABLE_TABLET | Freq: Four times a day (QID) | ORAL | Status: DC | PRN

## 2022-01-22 MED ORDER — BUPIVACAINE HCL (PF) 0.25 % IJ SOLN
INTRAMUSCULAR | Status: DC | PRN
  Administered 2022-01-22: 4 mL

## 2022-01-22 MED ORDER — PHENYLEPHRINE 80 MCG/ML (10ML) SYRINGE FOR IV PUSH (FOR BLOOD PRESSURE SUPPORT)
PREFILLED_SYRINGE | INTRAVENOUS | Status: AC
  Filled 2022-01-22: qty 10

## 2022-01-22 MED ORDER — SUGAMMADEX SODIUM 200 MG/2ML IV SOLN
INTRAVENOUS | Status: DC | PRN
  Administered 2022-01-22: 180 mg via INTRAVENOUS

## 2022-01-22 MED ORDER — ONDANSETRON HCL 4 MG/2ML IJ SOLN
4.0000 mg | Freq: Four times a day (QID) | INTRAMUSCULAR | Status: DC | PRN
  Administered 2022-01-22: 4 mg via INTRAVENOUS
  Filled 2022-01-22: qty 2

## 2022-01-22 MED ORDER — LIDOCAINE 2% (20 MG/ML) 5 ML SYRINGE
INTRAMUSCULAR | Status: DC | PRN
  Administered 2022-01-22: 100 mg via INTRAVENOUS

## 2022-01-22 MED ORDER — CHLORHEXIDINE GLUCONATE 0.12 % MT SOLN: 15.0000 mL | Freq: Once | OROMUCOSAL | Status: AC

## 2022-01-22 MED ORDER — FENTANYL CITRATE (PF) 100 MCG/2ML IJ SOLN
25.0000 ug | INTRAMUSCULAR | Status: DC | PRN
  Administered 2022-01-22: 50 ug via INTRAVENOUS
  Administered 2022-01-22: 25 ug via INTRAVENOUS

## 2022-01-22 MED ORDER — LACTATED RINGERS IV SOLN: INTRAVENOUS | Status: DC

## 2022-01-22 MED ORDER — POVIDONE-IODINE 10 % EX SWAB
2.0000 "application " | Freq: Once | CUTANEOUS | Status: AC
  Administered 2022-01-22: 2 via TOPICAL

## 2022-01-22 MED ORDER — MIDAZOLAM HCL 2 MG/2ML IJ SOLN
INTRAMUSCULAR | Status: DC | PRN
  Administered 2022-01-22: 2 mg via INTRAVENOUS

## 2022-01-22 MED ORDER — ACETAMINOPHEN 500 MG PO TABS: 1000.0000 mg | ORAL_TABLET | Freq: Once | ORAL | Status: AC

## 2022-01-22 MED ORDER — OXYCODONE HCL 5 MG/5ML PO SOLN: 5.0000 mg | Freq: Once | ORAL | Status: DC | PRN

## 2022-01-22 MED ORDER — SCOPOLAMINE 1 MG/3DAYS TD PT72: 1.0000 | MEDICATED_PATCH | TRANSDERMAL | Status: DC

## 2022-01-22 MED ORDER — DEXAMETHASONE SODIUM PHOSPHATE 10 MG/ML IJ SOLN
INTRAMUSCULAR | Status: DC | PRN
  Administered 2022-01-22: 5 mg via INTRAVENOUS

## 2022-01-22 MED ORDER — AMISULPRIDE (ANTIEMETIC) 5 MG/2ML IV SOLN
INTRAVENOUS | Status: AC
  Filled 2022-01-22: qty 2

## 2022-01-22 MED ORDER — SCOPOLAMINE 1 MG/3DAYS TD PT72
MEDICATED_PATCH | TRANSDERMAL | Status: AC
  Administered 2022-01-22: 1.5 mg via TRANSDERMAL
  Filled 2022-01-22: qty 1

## 2022-01-22 MED ORDER — MIDAZOLAM HCL 2 MG/2ML IJ SOLN
INTRAMUSCULAR | Status: AC
  Filled 2022-01-22: qty 2

## 2022-01-22 MED ORDER — HYDROMORPHONE HCL 1 MG/ML IJ SOLN
0.2000 mg | INTRAMUSCULAR | Status: DC | PRN
  Administered 2022-01-22 (×3): 0.6 mg via INTRAVENOUS
  Filled 2022-01-22 (×3): qty 1

## 2022-01-22 MED ORDER — PROPOFOL 10 MG/ML IV BOLUS
INTRAVENOUS | Status: AC
  Filled 2022-01-22: qty 20

## 2022-01-22 MED ORDER — FENTANYL CITRATE (PF) 100 MCG/2ML IJ SOLN
INTRAMUSCULAR | Status: AC
  Administered 2022-01-22: 25 ug via INTRAVENOUS
  Filled 2022-01-22: qty 2

## 2022-01-22 MED ORDER — KETOROLAC TROMETHAMINE 30 MG/ML IJ SOLN
30.0000 mg | Freq: Four times a day (QID) | INTRAMUSCULAR | Status: DC
  Administered 2022-01-22 – 2022-01-23 (×4): 30 mg via INTRAVENOUS
  Filled 2022-01-22 (×4): qty 1

## 2022-01-22 MED ORDER — AMISULPRIDE (ANTIEMETIC) 5 MG/2ML IV SOLN: 10.0000 mg | Freq: Once | INTRAVENOUS | Status: AC | PRN

## 2022-01-22 MED ORDER — PROPOFOL 10 MG/ML IV BOLUS
INTRAVENOUS | Status: DC | PRN
  Administered 2022-01-22: 200 mg via INTRAVENOUS

## 2022-01-22 MED ORDER — ONDANSETRON HCL 4 MG PO TABS: 4.0000 mg | ORAL_TABLET | Freq: Four times a day (QID) | ORAL | Status: DC | PRN

## 2022-01-22 MED ORDER — AMISULPRIDE (ANTIEMETIC) 5 MG/2ML IV SOLN
INTRAVENOUS | Status: AC
  Administered 2022-01-22: 10 mg via INTRAVENOUS
  Filled 2022-01-22: qty 2

## 2022-01-22 MED ORDER — OXYCODONE HCL 5 MG PO TABS: 5.0000 mg | ORAL_TABLET | Freq: Once | ORAL | Status: DC | PRN

## 2022-01-22 MED ORDER — CEFAZOLIN SODIUM-DEXTROSE 2-4 GM/100ML-% IV SOLN
2.0000 g | INTRAVENOUS | Status: AC
  Administered 2022-01-22: 2 g via INTRAVENOUS

## 2022-01-22 MED ORDER — OXYCODONE HCL 5 MG PO TABS
5.0000 mg | ORAL_TABLET | ORAL | Status: DC | PRN
  Administered 2022-01-22 – 2022-01-23 (×3): 10 mg via ORAL
  Filled 2022-01-22 (×3): qty 2

## 2022-01-22 MED ORDER — ACETAMINOPHEN 500 MG PO TABS
ORAL_TABLET | ORAL | Status: AC
  Administered 2022-01-22: 1000 mg via ORAL
  Filled 2022-01-22: qty 2

## 2022-01-22 MED ORDER — ONDANSETRON HCL 4 MG/2ML IJ SOLN: 4.0000 mg | Freq: Once | INTRAMUSCULAR | Status: DC | PRN

## 2022-01-22 MED ORDER — BUPIVACAINE HCL (PF) 0.25 % IJ SOLN
INTRAMUSCULAR | Status: AC
  Filled 2022-01-22: qty 30

## 2022-01-22 MED ORDER — CEFAZOLIN SODIUM-DEXTROSE 2-4 GM/100ML-% IV SOLN
INTRAVENOUS | Status: AC
  Filled 2022-01-22: qty 100

## 2022-01-22 MED ORDER — KETOROLAC TROMETHAMINE 30 MG/ML IJ SOLN
INTRAMUSCULAR | Status: DC | PRN
  Administered 2022-01-22: 30 mg via INTRAVENOUS

## 2022-01-22 MED ORDER — PHENYLEPHRINE 80 MCG/ML (10ML) SYRINGE FOR IV PUSH (FOR BLOOD PRESSURE SUPPORT)
PREFILLED_SYRINGE | INTRAVENOUS | Status: DC | PRN
  Administered 2022-01-22: 80 ug via INTRAVENOUS

## 2022-01-22 MED ORDER — ACETAMINOPHEN 500 MG PO TABS
1000.0000 mg | ORAL_TABLET | Freq: Four times a day (QID) | ORAL | Status: DC
  Administered 2022-01-23 (×2): 1000 mg via ORAL
  Filled 2022-01-22 (×2): qty 2

## 2022-01-22 MED ORDER — DOCUSATE SODIUM 100 MG PO CAPS: 100.0000 mg | ORAL_CAPSULE | Freq: Two times a day (BID) | ORAL | Status: DC

## 2022-01-22 MED ORDER — FENTANYL CITRATE (PF) 250 MCG/5ML IJ SOLN
INTRAMUSCULAR | Status: DC | PRN
  Administered 2022-01-22 (×2): 50 ug via INTRAVENOUS
  Administered 2022-01-22: 100 ug via INTRAVENOUS
  Administered 2022-01-22: 50 ug via INTRAVENOUS

## 2022-01-22 MED ORDER — CARBAMAZEPINE 200 MG PO TABS
200.0000 mg | ORAL_TABLET | Freq: Two times a day (BID) | ORAL | Status: DC
  Filled 2022-01-22 (×3): qty 1

## 2022-01-22 MED ORDER — ONDANSETRON HCL 4 MG/2ML IJ SOLN
INTRAMUSCULAR | Status: DC | PRN
  Administered 2022-01-22: 4 mg via INTRAVENOUS

## 2022-01-22 MED ORDER — FENTANYL CITRATE (PF) 250 MCG/5ML IJ SOLN
INTRAMUSCULAR | Status: AC
  Filled 2022-01-22: qty 5

## 2022-01-22 SURGICAL SUPPLY — 36 items
CANISTER SUCT 3000ML PPV (MISCELLANEOUS) ×2 IMPLANT
COVER BACK TABLE 60X90IN (DRAPES) ×2 IMPLANT
COVER MAYO STAND STRL (DRAPES) ×2 IMPLANT
DERMABOND ADVANCED (GAUZE/BANDAGES/DRESSINGS) ×1
DERMABOND ADVANCED .7 DNX12 (GAUZE/BANDAGES/DRESSINGS) IMPLANT
DURAPREP 26ML APPLICATOR (WOUND CARE) ×2 IMPLANT
ELECT REM PT RETURN 9FT ADLT (ELECTROSURGICAL) ×2
ELECTRODE REM PT RTRN 9FT ADLT (ELECTROSURGICAL) IMPLANT
FORCEPS CUTTING 45CM 5MM (CUTTING FORCEPS) ×2 IMPLANT
GLOVE BIOGEL PI IND STRL 6 (GLOVE) ×2 IMPLANT
GLOVE BIOGEL PI INDICATOR 6 (GLOVE) ×2
GLOVE SURG SS PI 6.0 STRL IVOR (GLOVE) ×1 IMPLANT
GLOVE SURG SS PI 7.0 STRL IVOR (GLOVE) ×2 IMPLANT
GLOVE SURG UNDER POLY LF SZ7 (GLOVE) ×6 IMPLANT
GOWN STRL REUS W/TWL LRG LVL3 (GOWN DISPOSABLE) ×5 IMPLANT
KIT TURNOVER KIT B (KITS) ×2 IMPLANT
LIGASURE IMPACT 36 18CM CVD LR (INSTRUMENTS) ×1 IMPLANT
NDL INSUFFLATION 14GA 120MM (NEEDLE) ×1 IMPLANT
NEEDLE INSUFFLATION 14GA 120MM (NEEDLE) ×2 IMPLANT
NS IRRIG 1000ML POUR BTL (IV SOLUTION) ×2 IMPLANT
PACK LAVH (CUSTOM PROCEDURE TRAY) ×2 IMPLANT
PACK TRENDGUARD 450 HYBRID PRO (MISCELLANEOUS) IMPLANT
PROTECTOR NERVE ULNAR (MISCELLANEOUS) ×3 IMPLANT
SET TUBE SMOKE EVAC HIGH FLOW (TUBING) ×2 IMPLANT
SUT MNCRL 0 MO-4 VIOLET 18 CR (SUTURE) ×2 IMPLANT
SUT MNCRL AB 3-0 PS2 27 (SUTURE) ×2 IMPLANT
SUT MON AB 2-0 CT1 36 (SUTURE) ×2 IMPLANT
SUT MONOCRYL 0 MO 4 18  CR/8 (SUTURE) ×1
SUT VICRYL 0 TIES 12 18 (SUTURE) ×2 IMPLANT
SUT VICRYL 0 UR6 27IN ABS (SUTURE) ×2 IMPLANT
TOWEL GREEN STERILE FF (TOWEL DISPOSABLE) ×3 IMPLANT
TRENDGUARD 450 HYBRID PRO PACK (MISCELLANEOUS) ×2
TROCAR XCEL NON-BLD 11X100MML (ENDOMECHANICALS) ×2 IMPLANT
TROCAR XCEL NON-BLD 5MMX100MML (ENDOMECHANICALS) ×2 IMPLANT
UNDERPAD 30X36 HEAVY ABSORB (UNDERPADS AND DIAPERS) ×2 IMPLANT
WARMER LAPAROSCOPE (MISCELLANEOUS) ×2 IMPLANT

## 2022-01-22 NOTE — Progress Notes (Signed)
Day of Surgery Procedure(s) (LRB): LAPAROSCOPIC ASSISTED VAGINAL HYSTERECTOMY WITH BILATERAL SALPINGECTOMY (Bilateral)  Subjective: Patient reports tolerating PO and no problems voiding.  Patient had severe pain with foley and requested removed several hours ago.  Feeling much better.  No N/V.  No CP/SOB.  Objective: I have reviewed patient's vital signs, intake and output, and medications.  General: alert, cooperative, and appears stated age Extremities: extremities normal, atraumatic, no cyanosis or edema Abd: soft, inc c/d/I x 2  Assessment: s/p Procedure(s): LAPAROSCOPIC ASSISTED VAGINAL HYSTERECTOMY WITH BILATERAL SALPINGECTOMY (Bilateral): stable, progressing well, and tolerating diet  Plan: Advance diet Encourage ambulation Advance to PO medication Saline lock in AM CBC and CMET in AM    LOS: 0 days    Linda Hedges 01/22/2022, 6:59 PM

## 2022-01-22 NOTE — Progress Notes (Signed)
No change to H&P.  Gina Vallez, DO 

## 2022-01-22 NOTE — Op Note (Signed)
PROCEDURE DATE: 01/22/2022 PREOPERATIVE DIAGNOSIS: Menorrhagia, dysmenorrhea  POSTOPERATIVE DIAGNOSIS: The same  PROCEDURE: Laparoscopic Assisted Vaginal Hysterectomy  SURGEON: Dr. Mitchel Honour  ASSISTANT: Dr. Nilda Simmer  INDICATIONS: 36 y.o. G1P1 with menorrhagia and dysmenorrhea desiring definitive surgical management. Risks of surgery were discussed with the patient including but not limited to: bleeding which may require transfusion or reoperation; infection which may require antibiotics; injury to bowel, bladder, ureters or other surrounding organs; need for additional procedures including laparotomy; thromboembolic phenomenon, incisional problems and other postoperative/anesthesia complications. Written informed consent was obtained.  FINDINGS: Small uterus, normal adnexa bilaterally. No evidence of endometriosis. Normal upper abdomen. Normal appearing liver edge, gall bladder and appendix ANESTHESIA: General  ESTIMATED BLOOD LOSS: 100 ml  SPECIMENS: Uterus, cervix and bilateral fallopian tubes  COMPLICATIONS: None immediate  PROCEDURE IN DETAIL: The patient received intravenous antibiotics and had sequential compression devices applied to her lower extremities while in the preoperative area. She was then taken to the operating room where general anesthesia was administered and was found to be adequate. She was placed in the dorsal lithotomy position, and was prepped and draped in a sterile manner. Foley catheter was placed. A uterine manipulator was then advanced into the uterus . After an adequate timeout was performed, attention was then turned to the patient's abdomen where a 10-mm skin incision was made in the umbilical fold. The Veress needle was carefully introduced into the peritoneal cavity through the abdominal wall. Intraperitoneal placement was confirmed by drop in intraabdominal pressure with insufflation of carbon dioxide gas. Adequate pneumoperitoneum was obtained, and the 10 mm  trocar and sleeve were then advanced without difficulty into the abdomen where intraabdominal placement was confirmed by the laparoscope. A survey of the patient's pelvis and abdomen revealed entirely normal anatomy. Suprapubic 5 mm port was then placed under direct visualization. The pelvis was then carefully examined. On the right side, the fallopian tube was elevated and using with Gyrus with serial clamp, cut and cauterize bites, the tube was entirely freed from the mesosalpinx.  Next, the round ligament was then clamped and transected with the Gyrus. The uteroovarian ligament was also clamped and transected. The leaves of the broad ligament were separated and serially transected. These procedures were then repeated on the left side.  The ureters were noted to be safely away from the area of dissection.  At this point, attention was turned to the vaginal portion of the case. A weighted speculum was placed posteriorly, a Deaver anteriorly, and the cervix grasped with a thyroid tenaculum. Once the anterior and posterior reflections were identified, the cervix was circumscribed using the Bovie knife. Next, using Mayos, the posterior cul-de-sac was entered. The uterosacral ligaments were bilaterally clamped, cut and suture ligated and tagged.  Next, the bladder reflection was identified. Using Metzenbaums, it was entered and palpation and direct visualization confirmed proper location. Next, using the LigaSure, the uterine arteries were coapted and cut bilaterally. The pedicles were visualized after coaptation and were hemostatic. The same was performed sequentially cephalad until the uterus, cervix and fallopian tubes were removed. The pedicles were inspected and found to be hemostatic. Next, the tagged uterosacrals were tied together in the midline. The remainder of the cuff was closed in figure-of-8 stitches using monocryl. The cuff was inspected and found to be hemostatic.  Attention was returned to the  abdomen were a second laparoscopic look was taken. All pedicles were hemostatic. Insufflation was removed after all instruments were removed.  Infraumbilical fascial incision was closed with  0 vicryl in figure of eight stitch.  All skin incisions were closed with 4-0 monocryl subcuticular stitches and Dermabond. The patient tolerated the procedures well. All instruments, needles, and sponge counts were correct x 2. The patient was taken to the recovery room awake, extubated and in stable condition.

## 2022-01-22 NOTE — Anesthesia Procedure Notes (Signed)
Procedure Name: Intubation Date/Time: 01/22/2022 7:35 AM Performed by: Imagene Riches, CRNA Pre-anesthesia Checklist: Patient identified, Emergency Drugs available, Suction available and Patient being monitored Patient Re-evaluated:Patient Re-evaluated prior to induction Oxygen Delivery Method: Circle System Utilized Preoxygenation: Pre-oxygenation with 100% oxygen Induction Type: IV induction Ventilation: Mask ventilation without difficulty Laryngoscope Size: Miller and 2 Grade View: Grade I Tube type: Oral Tube size: 7.0 mm Number of attempts: 1 Airway Equipment and Method: Stylet and Oral airway Placement Confirmation: ETT inserted through vocal cords under direct vision, positive ETCO2 and breath sounds checked- equal and bilateral Secured at: 22 cm Tube secured with: Tape Dental Injury: Teeth and Oropharynx as per pre-operative assessment

## 2022-01-22 NOTE — Transfer of Care (Signed)
Immediate Anesthesia Transfer of Care Note  Patient: Gina Gilbert  Procedure(s) Performed: LAPAROSCOPIC ASSISTED VAGINAL HYSTERECTOMY WITH BILATERAL SALPINGECTOMY (Bilateral)  Patient Location: PACU  Anesthesia Type:General  Level of Consciousness: drowsy  Airway & Oxygen Therapy: Patient Spontanous Breathing and Patient connected to nasal cannula oxygen  Post-op Assessment: Report given to RN and Post -op Vital signs reviewed and stable  Post vital signs: Reviewed and stable  Last Vitals:  Vitals Value Taken Time  BP 102/90 01/22/22 0939  Temp    Pulse 114 01/22/22 0948  Resp 23 01/22/22 0948  SpO2 96 % 01/22/22 0948  Vitals shown include unvalidated device data.  Last Pain:  Vitals:   01/22/22 0624  TempSrc:   PainSc: 1          Complications: No notable events documented.

## 2022-01-22 NOTE — Anesthesia Postprocedure Evaluation (Signed)
Anesthesia Post Note  Patient: Gina Gilbert  Procedure(s) Performed: LAPAROSCOPIC ASSISTED VAGINAL HYSTERECTOMY WITH BILATERAL SALPINGECTOMY (Bilateral)     Patient location during evaluation: PACU Anesthesia Type: General Level of consciousness: awake and alert Pain management: pain level controlled Vital Signs Assessment: post-procedure vital signs reviewed and stable Respiratory status: spontaneous breathing, nonlabored ventilation and respiratory function stable Cardiovascular status: blood pressure returned to baseline and stable Postop Assessment: no apparent nausea or vomiting Anesthetic complications: no   No notable events documented.  Last Vitals:  Vitals:   01/22/22 1025 01/22/22 1100  BP: 122/62 120/70  Pulse: (!) 113 (!) 110  Resp: (!) 24 20  Temp: 36.6 C 36.7 C  SpO2: 98% 96%    Last Pain:  Vitals:   01/22/22 1100  TempSrc: Oral  PainSc:                  [name]

## 2022-01-23 ENCOUNTER — Encounter (HOSPITAL_COMMUNITY): Payer: Self-pay | Admitting: Obstetrics & Gynecology

## 2022-01-23 DIAGNOSIS — N888 Other specified noninflammatory disorders of cervix uteri: Secondary | ICD-10-CM | POA: Diagnosis not present

## 2022-01-23 LAB — COMPREHENSIVE METABOLIC PANEL
ALT: 16 U/L (ref 0–44)
AST: 18 U/L (ref 15–41)
Albumin: 2.9 g/dL — ABNORMAL LOW (ref 3.5–5.0)
Alkaline Phosphatase: 56 U/L (ref 38–126)
Anion gap: 7 (ref 5–15)
BUN: 7 mg/dL (ref 6–20)
CO2: 24 mmol/L (ref 22–32)
Calcium: 8.5 mg/dL — ABNORMAL LOW (ref 8.9–10.3)
Chloride: 109 mmol/L (ref 98–111)
Creatinine, Ser: 0.75 mg/dL (ref 0.44–1.00)
GFR, Estimated: >60 mL/min (ref >60)
Glucose, Bld: 98 mg/dL (ref 70–99)
Potassium: 4 mmol/L (ref 3.5–5.1)
Sodium: 140 mmol/L (ref 135–145)
Total Bilirubin: 0.4 mg/dL (ref 0.3–1.2)
Total Protein: 5.2 g/dL — ABNORMAL LOW (ref 6.5–8.1)

## 2022-01-23 LAB — CBC
HCT: 34.2 % — ABNORMAL LOW (ref 36.0–46.0)
Hemoglobin: 10.9 g/dL — ABNORMAL LOW (ref 12.0–15.0)
MCH: 29.9 pg (ref 26.0–34.0)
MCHC: 31.9 g/dL (ref 30.0–36.0)
MCV: 94 fL (ref 80.0–100.0)
Platelets: 149 10*3/uL — ABNORMAL LOW (ref 150–400)
RBC: 3.64 MIL/uL — ABNORMAL LOW (ref 3.87–5.11)
RDW: 12.1 % (ref 11.5–15.5)
WBC: 7.6 10*3/uL (ref 4.0–10.5)
nRBC: 0 % (ref 0.0–0.2)

## 2022-01-23 MED ORDER — DOCUSATE SODIUM 100 MG PO CAPS: 100.0000 mg | ORAL_CAPSULE | Freq: Two times a day (BID) | ORAL | 0 refills | Status: AC

## 2022-01-23 MED ORDER — IBUPROFEN 600 MG PO TABS
600.0000 mg | ORAL_TABLET | Freq: Four times a day (QID) | ORAL | 0 refills | Status: AC | PRN
Start: 2022-01-23 — End: ?

## 2022-01-23 MED ORDER — OXYCODONE HCL 5 MG PO TABS: 10.0000 mg | ORAL_TABLET | ORAL | 0 refills | Status: AC | PRN

## 2022-01-23 NOTE — Progress Notes (Signed)
Discharge instructions provided to patient and home medications reviewed. Patient verbalizes understanding. Iv removed, patient discharged

## 2022-01-23 NOTE — Discharge Instructions (Signed)
Call MD for T>100.4, heavy vaginal bleeding, severe abdominal pain, intractable nausea and/or vomiting, or respiratory distress.  Call office to schedule postop in 2 weeks.  Pelvic rest x 6 weeks.  No driving while taking narcotics.

## 2022-01-23 NOTE — Discharge Summary (Signed)
Physician Discharge Summary  Patient ID: Gina Gilbert MRN: [id number] DOB/AGE: 01/03/1986 36 y.o.  Admit date: 01/22/2022 Discharge date: 01/23/2022  Admission Diagnoses: Dysmenorrhea, menorrhagia  Discharge Diagnoses:  Principal Problem:   Dysmenorrhea Active Problems:   S/P laparoscopic assisted vaginal hysterectomy (LAVH)   Discharged Condition: good  Hospital Course: Patient was admitted for planned LAVH, BS which was performed without complication.  Please see operative note for details.  On POD1, patient was meeting all postop goals.  She was ambulating without difficulty, voiding well, passing flatus, tolerating full diet and had adequate pain control on po meds.  She was discharged home with planned f/u in office in 2 weeks.    Consults: None  Significant Diagnostic Studies: n/a  Treatments: surgery: LAVH, BS  Discharge Exam: Blood pressure 104/74, pulse 94, temperature 98 F (36.7 C), temperature source Oral, resp. rate 20, height 5\' 5"  (1.651 m), weight 94.8 kg, SpO2 97 %, unknown if currently breastfeeding. General appearance: alert, cooperative, and appears stated age Extremities: extremities normal, atraumatic, no cyanosis or edema Incision/Wound: Abd: soft, ND.   Incisions c/d x 2  Disposition: Discharge disposition: 01-Home or Self Care        Allergies as of 01/23/2022       Reactions   Latex Swelling   Stadol [butorphanol]    "Lose my mind"        Medication List     STOP taking these medications    HYDROcodone-acetaminophen 5-325 MG tablet Commonly known as: Norco   Junel 1.5/30 1.5-30 MG-MCG tablet Generic drug: Norethindrone Acetate-Ethinyl Estradiol   norethindrone-ethinyl estradiol-FE 1-20 MG-MCG tablet Commonly known as: LOESTRIN FE       TAKE these medications    acetaminophen 500 MG tablet Commonly known as: TYLENOL Take 500 mg by mouth every 6 (six) hours as needed for moderate pain or headache.   carbamazepine 200  MG tablet Commonly known as: TEGRETOL Take 200 mg by mouth 2 (two) times daily.   citalopram 20 MG tablet Commonly known as: CELEXA Take 20 mg by mouth daily.   cyanocobalamin 1000 MCG/ML injection Commonly known as: (VITAMIN B-12) Inject 1,000 mcg into the muscle every 14 (fourteen) days.   CYCLOBENZAPRINE HCL PO Take by mouth. Unsure of dose   docusate sodium 100 MG capsule Commonly known as: COLACE Take 1 capsule (100 mg total) by mouth 2 (two) times daily.   ibuprofen 600 MG tablet Commonly known as: ADVIL Take 1 tablet (600 mg total) by mouth every 6 (six) hours as needed. What changed:  medication strength how much to take when to take this reasons to take this   MELATONIN GUMMIES PO Take 6 mg by mouth as needed.   oxyCODONE 5 MG immediate release tablet Commonly known as: Oxy IR/ROXICODONE Take 2 tablets (10 mg total) by mouth every 4 (four) hours as needed for moderate pain.         Signed: 06-28-1976 01/23/2022, 8:23 AM

## 2022-01-23 NOTE — Progress Notes (Signed)
1 Day Post-Op Procedure(s) (LRB): LAPAROSCOPIC ASSISTED VAGINAL HYSTERECTOMY WITH BILATERAL SALPINGECTOMY (Bilateral)  Subjective: Patient reports tolerating PO, + flatus, and no problems voiding.    Objective: I have reviewed patient's vital signs, intake and output, medications, and labs.  General: alert, cooperative, and appears stated age Extremities: extremities normal, atraumatic, no cyanosis or edema Abd: soft, approp TTP.  Inc c/d/I x 2  Assessment: s/p Procedure(s): LAPAROSCOPIC ASSISTED VAGINAL HYSTERECTOMY WITH BILATERAL SALPINGECTOMY (Bilateral): stable, progressing well, and tolerating diet  Plan: Discharge home  LOS: 0 days    Linda Hedges 01/23/2022, 8:17 AM

## 2022-01-24 LAB — SURGICAL PATHOLOGY

## 2023-07-12 ENCOUNTER — Other Ambulatory Visit: Payer: Self-pay

## 2023-07-12 ENCOUNTER — Emergency Department
Admission: EM | Admit: 2023-07-12 | Discharge: 2023-07-12 | Disposition: A | Discharge status: Home / Self Care | Payer: BC Managed Care – PPO | Attending: Emergency Medicine | Admitting: Emergency Medicine

## 2023-07-12 ENCOUNTER — Encounter: Payer: Self-pay | Admitting: Emergency Medicine

## 2023-07-12 DIAGNOSIS — R109 Unspecified abdominal pain: Secondary | ICD-10-CM

## 2023-07-12 DIAGNOSIS — N23 Unspecified renal colic: Secondary | ICD-10-CM | POA: Diagnosis not present

## 2023-07-12 DIAGNOSIS — R1031 Right lower quadrant pain: Secondary | ICD-10-CM | POA: Diagnosis present

## 2023-07-12 LAB — URINALYSIS, ROUTINE W REFLEX MICROSCOPIC
Bilirubin Urine: NEGATIVE
Glucose, UA: NEGATIVE mg/dL
Ketones, ur: NEGATIVE mg/dL
Leukocytes,Ua: NEGATIVE
Nitrite: NEGATIVE
Protein, ur: NEGATIVE mg/dL
RBC / HPF: >50 RBC/hpf (ref 0–5)
Specific Gravity, Urine: 1.025 (ref 1.005–1.030)
pH: 5 (ref 5.0–8.0)

## 2023-07-12 LAB — CBC
HCT: 41.4 % (ref 36.0–46.0)
Hemoglobin: 13.1 g/dL (ref 12.0–15.0)
MCH: 29.6 pg (ref 26.0–34.0)
MCHC: 31.6 g/dL (ref 30.0–36.0)
MCV: 93.5 fL (ref 80.0–100.0)
Platelets: 171 10*3/uL (ref 150–400)
RBC: 4.43 MIL/uL (ref 3.87–5.11)
RDW: 12.2 % (ref 11.5–15.5)
WBC: 3.8 10*3/uL — ABNORMAL LOW (ref 4.0–10.5)
nRBC: 0 % (ref 0.0–0.2)

## 2023-07-12 LAB — BASIC METABOLIC PANEL
Anion gap: 8 (ref 5–15)
BUN: 17 mg/dL (ref 6–20)
CO2: 22 mmol/L (ref 22–32)
Calcium: 8.4 mg/dL — ABNORMAL LOW (ref 8.9–10.3)
Chloride: 107 mmol/L (ref 98–111)
Creatinine, Ser: 0.63 mg/dL (ref 0.44–1.00)
GFR, Estimated: >60 mL/min (ref >60)
Glucose, Bld: 125 mg/dL — ABNORMAL HIGH (ref 70–99)
Potassium: 3.5 mmol/L (ref 3.5–5.1)
Sodium: 137 mmol/L (ref 135–145)

## 2023-07-12 MED ORDER — TAMSULOSIN HCL 0.4 MG PO CAPS: 0.4000 mg | ORAL_CAPSULE | Freq: Every day | ORAL | 0 refills | Status: AC

## 2023-07-12 MED ORDER — KETOROLAC TROMETHAMINE 30 MG/ML IJ SOLN
30.0000 mg | Freq: Once | INTRAMUSCULAR | Status: AC
  Administered 2023-07-12: 30 mg via INTRAMUSCULAR
  Filled 2023-07-12: qty 1

## 2023-07-12 MED ORDER — ONDANSETRON HCL 4 MG PO TABS
4.0000 mg | ORAL_TABLET | Freq: Three times a day (TID) | ORAL | 0 refills | Status: AC | PRN
Start: 2023-07-12 — End: ?

## 2023-07-12 MED ORDER — ONDANSETRON 4 MG PO TBDP
4.0000 mg | ORAL_TABLET | Freq: Once | ORAL | Status: AC
  Administered 2023-07-12: 4 mg via ORAL
  Filled 2023-07-12: qty 1

## 2023-07-12 MED ORDER — ACETAMINOPHEN 500 MG PO TABS
1000.0000 mg | ORAL_TABLET | Freq: Once | ORAL | Status: AC
Start: 2023-07-12 — End: 2023-07-12
  Administered 2023-07-12: 1000 mg via ORAL
  Filled 2023-07-12: qty 2

## 2023-07-12 NOTE — Discharge Instructions (Addendum)
Take acetaminophen 650 mg and ibuprofen 400 mg every 6 hours for pain.  Take with food. Take Zofran for nausea as needed.  Take Flomax as prescribed.  Call Dr Richardo Hanks of urology for an appointment.   Thank you for choosing [place] for your health care today!  Please see your primary doctor this week for a follow up appointment.   If you have any new, worsening, or unexpected symptoms call your doctor right away or come back to the emergency department for reevaluation.  It was my pleasure to care for you today.   Daneil Dan Modesto Charon, MD

## 2023-07-12 NOTE — ED Provider Notes (Signed)
[facility] Provider Note    Event Date/Time   First MD Initiated Contact with Patient 07/12/23 [phone]     (approximate)   History   Flank Pain   HPI  TANGLA MOROSKY is a 37 y.o. female   Past medical history of prior kidney stones who presents to the emergency department with right-sided flank pain radiating to the groin acute onset tonight, severe and intermittent.  No dysuria or frequency.  No fever.  She is status post hysterectomy.  Independent Historian contributed to assessment above: Her mother is here to corroborate information past medical history as above    Physical Exam   Triage Vital Signs: ED Triage Vitals  Encounter Vitals Group     BP 07/12/23 0312 97/69     Systolic BP Percentile --      Diastolic BP Percentile --      Pulse Rate 07/12/23 0312 80     Resp 07/12/23 0312 16     Temp 07/12/23 0312 97.6 F (36.4 C)     Temp Source 07/12/23 0312 Oral     SpO2 07/12/23 0312 98 %     Weight 07/12/23 0312 180 lb (81.6 kg)     Height 07/12/23 0312 5\' 6"  (1.676 m)     Head Circumference --      Peak Flow --      Pain Score 07/12/23 0322 7     Pain Loc --      Pain Education --      Exclude from Growth Chart --     Most recent vital signs: Vitals:   07/12/23 0312  BP: 97/69  Pulse: 80  Resp: 16  Temp: 97.6 F (36.4 C)  SpO2: 98%    General: Awake, no distress.  CV:  Good peripheral perfusion.  Resp:  Normal effort.  Abd:  No distention.  Other:  Right-sided CVA tenderness without any abdominal tenderness to palpation.  Normal vital signs.  Afebrile.   ED Results / Procedures / Treatments   Labs (all labs ordered are listed, but only abnormal results are displayed) Labs Reviewed  BASIC METABOLIC PANEL - Abnormal; Notable for the following components:      Result Value   Glucose, Bld 125 (*)    Calcium 8.4 (*)    All other components within normal limits  CBC - Abnormal; Notable for the following components:    WBC 3.8 (*)    All other components within normal limits  URINALYSIS, ROUTINE W REFLEX MICROSCOPIC - Abnormal; Notable for the following components:   Color, Urine YELLOW (*)    APPearance HAZY (*)    Hgb urine dipstick MODERATE (*)    Bacteria, UA RARE (*)    All other components within normal limits  POC URINE PREG, ED     I ordered and reviewed the above labs they are notable for rare bacteria without inflammatory changes, white blood cell count 3.8, renal function normal electrolytes normal   PROCEDURES:  Critical Care performed: No  Procedures   MEDICATIONS ORDERED IN ED: Medications  ketorolac (TORADOL) 30 MG/ML injection 30 mg (has no administration in time range)  ondansetron (ZOFRAN-ODT) disintegrating tablet 4 mg (has no administration in time range)  acetaminophen (TYLENOL) tablet 1,000 mg (has no administration in time range)    IMPRESSION / MDM / ASSESSMENT AND PLAN / ED COURSE  I reviewed the triage vital signs and the nursing notes.  Patient's presentation is most consistent with acute presentation with potential threat to life or bodily function.  Differential diagnosis includes, but is not limited to, renal colic ureterolithiasis, obstructive uropathy, urinary tract infection, appendicitis, ovarian torsion   The patient is on the cardiac monitor to evaluate for evidence of arrhythmia and/or significant heart rate changes.  MDM:    Right-sided flank pain with radiation to the groin with a history of kidney stones most likely kidney stone.  Right-sided CVA tenderness also suggest kidney stone.  Soft benign abdominal exam rules against surgical abdominal or pelvic pathologies.  Urinalysis with rare bacteria no inflammatory changes without dysuria or frequency I doubt urinary tract infection at this time.  I offered a CT scan to assess for location of stone, confirm diagnosis, but patient is reluctant to get radiation at this  time and rather manage her symptoms with medications only.  Will give medications, antiemetics, and if pain is well-controlled plan for discharge on pain medications Flomax and have urology follow-up.       FINAL CLINICAL IMPRESSION(S) / ED DIAGNOSES   Final diagnoses:  Ureteral colic  Flank pain     Rx / DC Orders   ED Discharge Orders          Ordered    tamsulosin (FLOMAX) 0.4 MG CAPS capsule  Daily        07/12/23 0348    ondansetron (ZOFRAN) 4 MG tablet  Every 8 hours PRN        07/12/23 0348             Note:  This document was prepared using Dragon voice recognition software and may include unintentional dictation errors.    Pilar Jarvis, MD 07/12/23 [phone]

## 2023-07-12 NOTE — ED Triage Notes (Signed)
Pt in via POV, reports sudden onset right flank pain w/ radiation to right abdomen, plus nausea/vomiting.  Reports hx of kidney stones, appears uncomfortable in triage.  Vitals WDL.

## 2024-03-30 ENCOUNTER — Other Ambulatory Visit: Payer: Self-pay | Admitting: Family Medicine

## 2024-03-30 ENCOUNTER — Other Ambulatory Visit

## 2024-03-30 ENCOUNTER — Ambulatory Visit
Admission: RE | Admit: 2024-03-30 | Discharge: 2024-03-30 | Disposition: A | Discharge status: Home / Self Care | Source: Ambulatory Visit | Attending: Family Medicine | Admitting: Family Medicine

## 2024-03-30 DIAGNOSIS — R519 Headache, unspecified: Secondary | ICD-10-CM | POA: Diagnosis not present

## 2024-03-30 DIAGNOSIS — S0990XA Unspecified injury of head, initial encounter: Secondary | ICD-10-CM

## 2024-03-30 DIAGNOSIS — S0992XA Unspecified injury of nose, initial encounter: Secondary | ICD-10-CM | POA: Diagnosis present

## 2024-03-30 DIAGNOSIS — Y92009 Unspecified place in unspecified non-institutional (private) residence as the place of occurrence of the external cause: Secondary | ICD-10-CM | POA: Insufficient documentation

## 2024-03-30 DIAGNOSIS — R110 Nausea: Secondary | ICD-10-CM | POA: Diagnosis not present

## 2024-03-30 DIAGNOSIS — W19XXXA Unspecified fall, initial encounter: Secondary | ICD-10-CM

## 2024-03-30 NOTE — Progress Notes (Signed)
 Chief Complaint  Patient presents with  . Fall    Head hurts, nauseous, body sore,     Patient is agreeable to [employer] scribe.   History of Present Illness Gina Gilbert is a 38 year old female with vasovagal syncope who presents with facial injuries following a fall.  She experienced a fall last night after fainting, attributed to her history of vasovagal syncope. This episode occurred rapidly, without her usual warning signs. She has a history of fainting episodes, previously associated with her menstrual cycle, but now occurring with severe stomach upset since her hysterectomy.  No chest pains or palpitations.  Following the fall, she sustained a bloody nose and a contusion on her forehead. She has experienced a similar injury in the past, having previously broken her nose in a similar manner. Her right shoulder is painful from the impact, but she believes it is only bruised. She has not taken any medication this morning but took two ibuprofen  and two Tylenol  last night and applied an ice pack to her face.  She has a headache and nausea but no vision changes or light sensitivity. She has not vomited but felt close to it while in the waiting room. Her neck is stiff, which she attributes to the fall, but she denies any significant neck pain. She has not experienced any current bleeding from her nose, although it bled slightly in the shower this morning.    ROS  Review of systems is unremarkable for any active cardiac, respiratory, GI, GU, hematologic, neurologic, dermatologic, HEENT, or psychiatric symptoms except as noted above.  No fevers, chills, or constitutional symptoms.   Current Outpatient Medications  Medication Sig Dispense Refill  . acetaminophen  (TYLENOL ) 500 MG tablet Take 1,000 mg by mouth every 8 (eight) hours as needed for Pain    . ALPRAZolam (XANAX) 0.25 MG tablet Take 0.25 mg by mouth once daily as needed for Anxiety    . citalopram  (CELEXA ) 20 MG tablet Take 1  tablet by mouth daily with breakfast    . cyanocobalamin (VITAMIN B12) 1,000 mcg/mL injection Injection 1 ml into the muscle every 2 weeks 10 mL 1  . ibuprofen  (MOTRIN ) 800 MG tablet Take 800 mg by mouth every 6 (six) hours as needed for Pain    . UNABLE TO FIND 200 mg 2 (two) times daily Med Name: CARVAMAZEPINE    . albuterol 90 mcg/actuation inhaler Inhale 2 inhalations into the lungs every 6 (six) hours as needed for Wheezing (Patient not taking: Reported on 03/30/2024) 1 each 2  . BD LUER-LOK SYRINGE 3 mL 25 gauge x 1 Syrg as directed (Patient not taking: Reported on 10/21/2023)    . budesonide (PULMICORT) 1 mg/2 mL nebulizer solution Take 2 mLs (1 mg total) by nebulization once daily (Patient not taking: Reported on 10/21/2023) 60 mL 11  . busPIRone (BUSPAR) 10 MG tablet TAKE 1 TO 2 TABLETS BY MOUTH EVERY DAY (Patient not taking: Reported on 10/21/2023)    . carBAMazepine  (TEGRETOL ) 200 mg tablet Take 1 tablet by mouth 2 (two) times daily (Patient not taking: Reported on 03/30/2024)    . fluticasone propionate (FLONASE) 50 mcg/actuation nasal spray Place 2 sprays into both nostrils once daily (Patient not taking: Reported on 03/30/2024) 16 g 1  . HYDROcodone -chlorpheniramine (TUSSIONEX) 10-8 mg/5 mL ER suspension Take 5 mLs by mouth every 12 (twelve) hours as needed for Cough (Patient not taking: Reported on 03/30/2024) 120 mL 0  . meloxicam (MOBIC) 15 MG tablet Take 1  tablet (15 mg total) by mouth once daily for 14 days 14 tablet 0  . NP THYROID 15 mg tablet TAKE 1 TABLET BY MOUTH ON AN EMPTY STOMACH ONCE DAILY (Patient not taking: Reported on 03/30/2024)    . ondansetron  (ZOFRAN -ODT) 4 MG disintegrating tablet Take 1 tablet (4 mg total) by mouth every 8 (eight) hours as needed for Nausea for up to 7 days 20 tablet 0  . testosterone (FORTESTA) 10 mg/0.5 gram /actuation gel in metered dose pump 40mg  topical (Patient not taking: Reported on 03/30/2024)    . WEGOVY 0.25 mg/0.5 mL pen injector INJECT 0.25 MG  UNDER THE SKIN ONCE WEEKLY FOR 4 WEEKS (Patient not taking: Reported on 03/30/2024)     No current facility-administered medications for this visit.    Allergies as of 03/30/2024 - Reviewed 03/30/2024  Allergen Reaction Noted  . Latex Swelling 06/05/2014  . Butorphanol  Unknown 06/24/2019    Patient Active Problem List  Diagnosis  . Anxiety, generalized  . Moderate episode of recurrent major depressive disorder (CMS-HCC) - followed by [facility])  . B12 deficiency (560 - 03/30/23) inj q2 wks  . Bipolar affective disorder in remission (CMS-HCC) - followed by [facility])  . Acne vulgaris - followed by Dr. Isenstein  . Low libido - followed by [facility] ([place])    Past Medical History:  Diagnosis Date  . Anxiety   . Depression   . Vitamin B12 deficiency     Past Surgical History:  Procedure Laterality Date  . Left wrist dorsal ganglion cyst excision Left 06/30/2019   Dr. Kathlynn  . CESAREAN SECTION    . TONSILLECTOMY      Vitals:   03/30/24 1219  BP: 108/82  Pulse: 73  SpO2: 94%  Weight: 86.2 kg (190 lb)  Height: 168.9 cm (5' 6.5)  PainSc:   5   Body mass index is 30.21 kg/m.  Exam BP 108/82   Pulse 73   Ht 168.9 cm (5' 6.5)   Wt 86.2 kg (190 lb)   LMP  (LMP Unknown) Comment: taking bcp constant, to eliminate periods  SpO2 94%   BMI 30.21 kg/m   General. Well appearing; NAD; VS reviewed     Eyes. Sclera and conjunctiva clear; Vision grossly intact; extraocular movements intact Head: Small swelling and bruising to the left parietal portion of the skull with significant tenderness.  Nasal bridge with swelling and slight deformity and tenderness to palpate.  TMs are without any erythema or effusion.  No blood on the TMs. Oropharynx. No suspicious lesions Neck. Supple. No swelling, masses, thyroid normal size, no masses palpated.   Lungs. Respirations unlabored; clear to auscultation bilaterally Cardiovascular. Heart regular rate and rhythm  without murmurs, gallops, or rubs MSK: No edema; patient has full range of motion of shoulder elbow wrist.  Strength is grossly intact throughout the upper extremities.  No cervical or thoracic or lumbar spinal tenderness. Skin. Normal color and turgor Neurologic. Alert and oriented x3; CN 2-12 grossly intact; no focal deficits  Assessment & Plan  Facial trauma with suspected nasal bone fracture Suspected nasal bone fracture with significant swelling, concern for deeper fractures not visible on x-ray. - Order CT scan of head and face to assess for fractures and potential displacement. - Consult ENT if CT scan shows a major fracture. - Manage conservatively with anti-inflammatories if CT scan is negative for major fracture. - Patient with suspected concussion and facial trauma we will send in meloxicam  to start tomorrow once daily with food to help with headaches and inflammation.  If CT head scan does not show any head bleed we can offer ketorolac  injection today.  Concussion, suspected Suspicion of concussion due to mechanism of injury and symptoms. Awaiting CT scan to rule out intracranial bleed before administering Toradol .  Mild hematoma to the left frontal portion of the skull.  Given trauma and persistent nausea and headaches we will go ahead and order CT head. - Order CT scan of head to rule out intracranial bleed. - Administer Toradol  injection for pain management if CT scan is negative for bleed. - Prescribe Zofran  disintegrating tablets for nausea as needed.  Nausea, acute Acute nausea likely secondary to suspected concussion. - Prescribe Zofran  disintegrating tablets to take as needed for nausea.  Right shoulder contusion Right shoulder pain likely due to contusion with good mobility.  Defer x-ray at this time. - Manage with anti-inflammatories to reduce pain and inflammation.  Vasovagal syncope, recurrent Recurrent vasovagal syncope episodes, historically associated with  gastrointestinal upset.    F/U: Patient follow-up to be determined based on imaging and response to treatment.  JASON HESTLE WHITAKER, PA  This note has been created using automated tools and reviewed for accuracy by JASON HESTLE WHITAKER.   Note: This dictation was prepared with Dragon dictation along with smaller phrase technology. Any transcriptional errors that result from this process are unintentional.

## 2024-05-18 ENCOUNTER — Other Ambulatory Visit: Payer: Self-pay | Admitting: Otolaryngology

## 2024-05-18 DIAGNOSIS — R55 Syncope and collapse: Secondary | ICD-10-CM

## 2024-05-18 DIAGNOSIS — G5139 Clonic hemifacial spasm, unspecified: Secondary | ICD-10-CM

## 2024-05-25 ENCOUNTER — Encounter: Payer: Self-pay | Admitting: Family Medicine

## 2024-05-27 ENCOUNTER — Other Ambulatory Visit: Payer: Self-pay | Admitting: Family Medicine

## 2024-05-27 ENCOUNTER — Encounter: Payer: Self-pay | Admitting: Family Medicine

## 2024-05-27 DIAGNOSIS — R55 Syncope and collapse: Secondary | ICD-10-CM

## 2024-05-27 DIAGNOSIS — R519 Headache, unspecified: Secondary | ICD-10-CM

## 2024-05-27 DIAGNOSIS — W19XXXA Unspecified fall, initial encounter: Secondary | ICD-10-CM

## 2024-05-27 DIAGNOSIS — S0990XA Unspecified injury of head, initial encounter: Secondary | ICD-10-CM

## 2024-05-30 ENCOUNTER — Ambulatory Visit
Admission: RE | Admit: 2024-05-30 | Discharge: 2024-05-30 | Disposition: A | Source: Ambulatory Visit | Attending: Otolaryngology | Admitting: Otolaryngology

## 2024-05-30 DIAGNOSIS — G5139 Clonic hemifacial spasm, unspecified: Secondary | ICD-10-CM

## 2024-05-30 DIAGNOSIS — R55 Syncope and collapse: Secondary | ICD-10-CM

## 2024-05-30 MED ORDER — GADOPICLENOL 0.5 MMOL/ML IV SOLN
10.0000 mL | Freq: Once | INTRAVENOUS | Status: AC | PRN
  Administered 2024-05-30: 9 mL via INTRAVENOUS

## 2024-08-06 ENCOUNTER — Emergency Department: Admission: EM | Admit: 2024-08-06 | Discharge: 2024-08-06 | Disposition: A | Discharge status: Home / Self Care

## 2024-08-06 ENCOUNTER — Other Ambulatory Visit: Payer: Self-pay

## 2024-08-06 ENCOUNTER — Encounter: Payer: Self-pay | Admitting: *Deleted

## 2024-08-06 DIAGNOSIS — Z5189 Encounter for other specified aftercare: Secondary | ICD-10-CM

## 2024-08-06 DIAGNOSIS — Z4801 Encounter for change or removal of surgical wound dressing: Secondary | ICD-10-CM | POA: Insufficient documentation

## 2024-08-06 NOTE — ED Triage Notes (Signed)
 Pt had EP LOOP MONITOR IMPLANT done at [facility] on 12/5. Pt says that the steri strips came off tonight with some of the glue and the wound opened up, and she had some bleeding (no bleeding in triage. Small incision area to the left medial breast, one edge well approximated, small opening to the lateral edge)

## 2024-08-06 NOTE — Discharge Instructions (Addendum)
 Please reach out to your EP team on Monday.   Return to the ED with any worsening symptoms.  It was a pleasure to care for you today!

## 2024-08-06 NOTE — ED Provider Notes (Signed)
 [facility] Provider Note    Event Date/Time   First MD Initiated Contact with Patient 08/06/24 2115     (approximate)   History   Wound Check   HPI  Gina Gilbert is a 38 y.o. female with PMH of anxiety, depression, bipolar disorder and syncope with recent EP loop monitor placement by [facility] on 12/5 who presents for evaluation of a wound check.  Patient states that the Steri-Strips over the incision site for loop monitor were beginning to fall off so she took them off.  With the last Steri-Strip a bit of the glue came off as well and the wound opened up just a little bit.  She had some bleeding at the site.  Patient called the on-call EP provider who recommended she come immediately to the emergency department for evaluation.      Physical Exam   Triage Vital Signs: ED Triage Vitals  Encounter Vitals Group     BP 08/06/24 2006 112/75     Girls Systolic BP Percentile --      Girls Diastolic BP Percentile --      Boys Systolic BP Percentile --      Boys Diastolic BP Percentile --      Pulse Rate 08/06/24 2006 86     Resp 08/06/24 2006 16     Temp 08/06/24 2006 98.7 F (37.1 C)     Temp src --      SpO2 08/06/24 2006 99 %     Weight --      Height --      Head Circumference --      Peak Flow --      Pain Score 08/06/24 2007 3     Pain Loc --      Pain Education --      Exclude from Growth Chart --     Most recent vital signs: Vitals:   08/06/24 2006  BP: 112/75  Pulse: 86  Resp: 16  Temp: 98.7 F (37.1 C)  SpO2: 99%   General: Awake, no distress.  CV:  Good peripheral perfusion.  Resp:  Normal effort.  Abd:  No distention.  Other:  Linear incision to the left medial breast approximately 1-1/2 cm in length, the medial side of the incision is opened about 2 mm wide and is less than 1 mm deep, no active bleeding   ED Results / Procedures / Treatments   Labs (all labs ordered are listed, but only abnormal results are  displayed) Labs Reviewed - No data to display   PROCEDURES:  Critical Care performed: No  Procedures   MEDICATIONS ORDERED IN ED: Medications - No data to display   IMPRESSION / MDM / ASSESSMENT AND PLAN / ED COURSE  I reviewed the triage vital signs and the nursing notes.                             38 year old female presents for evaluation of a wound check.  Vital signs are stable patient NAD on exam.  Differential diagnosis includes, but is not limited to, wound check, postop complication, postop bleeding, wound infection.  Patient's presentation is most consistent with acute, uncomplicated illness.  I reached out to the on-call electrophysiology provider at [facility] to discuss as patient reported they had a very high level of concern.  On exam wound is unimpressive.  There is a very small opening as described in the  physical exam portion, no active bleeding, no erythema, warmth or drainage.  Do not have any concerns for infection at this time.  It seems as though there may have been a miscommunication between the patient and the on-call provider as he was concerned that the device could be directly visualized through the incision site.  I explained to the provider that the wound was very superficial and no longer bleeding.  I cannot see the device at all as it is not communicating deeper to the skin.  The on-call provider recommended I replace the Steri-Strips and that she then reach out to the office on Monday for follow-up.  Patient was updated on plan.  She voiced understanding, all questions were answered and she was stable at discharge.      FINAL CLINICAL IMPRESSION(S) / ED DIAGNOSES   Final diagnoses:  Visit for wound check     Rx / DC Orders   ED Discharge Orders     None        Note:  This document was prepared using Dragon voice recognition software and may include unintentional dictation errors.   Cleaster Tinnie LABOR, PA-C 08/06/24 2302    Fernand Rossie HERO, MD 08/08/24 [phone]
